# Patient Record
Sex: Female | Born: 1955 | Hispanic: No | Smoking: Never smoker
Health system: Southern US, Community
[De-identification: ages and names within clinical notes are randomized; demographics above are authoritative.]

## PROBLEM LIST (undated history)

## (undated) DIAGNOSIS — M1990 Unspecified osteoarthritis, unspecified site: Secondary | ICD-10-CM

## (undated) DIAGNOSIS — M069 Rheumatoid arthritis, unspecified: Secondary | ICD-10-CM

## (undated) HISTORY — PX: TUBAL LIGATION: SHX77

## (undated) HISTORY — PX: CHOLECYSTECTOMY: SHX55

---

## 1998-08-19 ENCOUNTER — Other Ambulatory Visit: Admission: RE | Admit: 1998-08-19 | Discharge: 1998-08-19 | Payer: Self-pay | Admitting: Obstetrics and Gynecology

## 1999-08-06 ENCOUNTER — Other Ambulatory Visit: Admission: RE | Admit: 1999-08-06 | Discharge: 1999-08-06 | Payer: Self-pay | Admitting: Obstetrics and Gynecology

## 2000-08-16 ENCOUNTER — Other Ambulatory Visit: Admission: RE | Admit: 2000-08-16 | Discharge: 2000-08-16 | Payer: Self-pay | Admitting: Obstetrics and Gynecology

## 2000-08-16 ENCOUNTER — Encounter: Payer: Self-pay | Admitting: Obstetrics and Gynecology

## 2000-08-16 ENCOUNTER — Encounter: Admission: RE | Admit: 2000-08-16 | Discharge: 2000-08-16 | Payer: Self-pay | Admitting: Obstetrics and Gynecology

## 2001-07-26 ENCOUNTER — Encounter: Payer: Self-pay | Admitting: Obstetrics and Gynecology

## 2001-07-26 ENCOUNTER — Other Ambulatory Visit: Admission: RE | Admit: 2001-07-26 | Discharge: 2001-07-26 | Payer: Self-pay | Admitting: Obstetrics and Gynecology

## 2001-07-26 ENCOUNTER — Encounter: Admission: RE | Admit: 2001-07-26 | Discharge: 2001-07-26 | Payer: Self-pay | Admitting: Obstetrics and Gynecology

## 2001-08-01 ENCOUNTER — Other Ambulatory Visit: Admission: RE | Admit: 2001-08-01 | Discharge: 2001-08-01 | Payer: Self-pay | Admitting: Obstetrics and Gynecology

## 2001-08-01 ENCOUNTER — Encounter (INDEPENDENT_AMBULATORY_CARE_PROVIDER_SITE_OTHER): Payer: Self-pay | Admitting: Specialist

## 2002-02-28 ENCOUNTER — Encounter: Payer: Self-pay | Admitting: Obstetrics and Gynecology

## 2002-02-28 ENCOUNTER — Encounter: Admission: RE | Admit: 2002-02-28 | Discharge: 2002-02-28 | Payer: Self-pay | Admitting: Obstetrics and Gynecology

## 2002-03-01 ENCOUNTER — Other Ambulatory Visit: Admission: RE | Admit: 2002-03-01 | Discharge: 2002-03-01 | Payer: Self-pay | Admitting: Obstetrics and Gynecology

## 2003-10-01 ENCOUNTER — Encounter: Admission: RE | Admit: 2003-10-01 | Discharge: 2003-10-01 | Payer: Self-pay | Admitting: Obstetrics and Gynecology

## 2003-10-01 ENCOUNTER — Other Ambulatory Visit: Admission: RE | Admit: 2003-10-01 | Discharge: 2003-10-01 | Payer: Self-pay | Admitting: Obstetrics and Gynecology

## 2006-03-14 ENCOUNTER — Encounter: Admission: RE | Admit: 2006-03-14 | Discharge: 2006-03-14 | Payer: Self-pay | Admitting: Obstetrics and Gynecology

## 2007-03-30 ENCOUNTER — Encounter: Admission: RE | Admit: 2007-03-30 | Discharge: 2007-03-30 | Payer: Self-pay | Admitting: Obstetrics and Gynecology

## 2009-10-17 ENCOUNTER — Encounter: Admission: RE | Admit: 2009-10-17 | Discharge: 2009-10-17 | Payer: Self-pay | Admitting: Obstetrics and Gynecology

## 2009-10-22 ENCOUNTER — Encounter: Admission: RE | Admit: 2009-10-22 | Discharge: 2009-10-22 | Payer: Self-pay | Admitting: Obstetrics and Gynecology

## 2010-12-19 ENCOUNTER — Encounter: Payer: Self-pay | Admitting: Obstetrics and Gynecology

## 2012-12-12 ENCOUNTER — Other Ambulatory Visit: Payer: Self-pay | Admitting: Obstetrics and Gynecology

## 2012-12-12 DIAGNOSIS — N644 Mastodynia: Secondary | ICD-10-CM

## 2012-12-12 DIAGNOSIS — N6321 Unspecified lump in the left breast, upper outer quadrant: Secondary | ICD-10-CM

## 2012-12-13 ENCOUNTER — Other Ambulatory Visit: Payer: Self-pay | Admitting: Obstetrics and Gynecology

## 2012-12-13 DIAGNOSIS — N6321 Unspecified lump in the left breast, upper outer quadrant: Secondary | ICD-10-CM

## 2012-12-21 ENCOUNTER — Ambulatory Visit
Admission: RE | Admit: 2012-12-21 | Discharge: 2012-12-21 | Disposition: A | Discharge status: Home / Self Care | Payer: Self-pay | Source: Ambulatory Visit | Attending: Obstetrics and Gynecology | Admitting: Obstetrics and Gynecology

## 2012-12-21 DIAGNOSIS — N6321 Unspecified lump in the left breast, upper outer quadrant: Secondary | ICD-10-CM

## 2013-06-20 ENCOUNTER — Ambulatory Visit: Payer: Self-pay | Admitting: Family Medicine

## 2013-06-20 ENCOUNTER — Ambulatory Visit: Payer: Self-pay

## 2013-06-20 VITALS — BP 122/80 | HR 74 | Temp 98.2°F | Resp 16 | Ht 63.0 in | Wt 167.4 lb

## 2013-06-20 DIAGNOSIS — S59902A Unspecified injury of left elbow, initial encounter: Secondary | ICD-10-CM

## 2013-06-20 DIAGNOSIS — S80212A Abrasion, left knee, initial encounter: Secondary | ICD-10-CM

## 2013-06-20 DIAGNOSIS — IMO0002 Reserved for concepts with insufficient information to code with codable children: Secondary | ICD-10-CM

## 2013-06-20 DIAGNOSIS — S59919A Unspecified injury of unspecified forearm, initial encounter: Secondary | ICD-10-CM

## 2013-06-20 DIAGNOSIS — S60511A Abrasion of right hand, initial encounter: Secondary | ICD-10-CM

## 2013-06-20 DIAGNOSIS — S59909A Unspecified injury of unspecified elbow, initial encounter: Secondary | ICD-10-CM

## 2013-06-20 DIAGNOSIS — Z23 Encounter for immunization: Secondary | ICD-10-CM

## 2013-06-20 MED ORDER — HYDROCODONE-ACETAMINOPHEN 5-325 MG PO TABS: 1.0000 | ORAL_TABLET | Freq: Three times a day (TID) | ORAL | Status: DC | PRN

## 2013-06-20 MED ORDER — TETANUS-DIPHTHERIA TOXOIDS TD 2-2 LF/0.5ML IM SUSP: 0.5000 mL | Freq: Once | INTRAMUSCULAR | Status: DC

## 2013-06-20 NOTE — Progress Notes (Signed)
[facility] [address], [place] [address] [phone]  Date:  06/20/2013   Name:  Elizabeth Yoder   DOB:  [birth year]   MRN:  [id number]  PCP:  No primary provider on file.    Chief Complaint: Elbow Injury, Knee Injury and Extremity Laceration   History of Present Illness:  Elizabeth Yoder is a 57 y.o. very pleasant female patient who presents with the following:  She was taking out some trash around 0900 this am- she tripped and fell onto her left arm and hand, and her left knee on a cememnt driveway.  Also cut the palm of her right hand. The left elbow is the main problem.  She is otherwise generally healthy.  Last tetanus shot at least 20 years ago.  She usually lives in [place] doing [place] work, but [place] elderly friend.   No head injury, no LOC.  She had one hydrocodone pill left over from before that she took.  Right now her pain is not bad.    There are no active problems to display for this patient.   History reviewed. No pertinent past medical history.  Past Surgical History  Procedure Laterality Date  . Tubal ligation      History  Substance Use Topics  . Smoking status: Never Smoker   . Smokeless tobacco: Not on file  . Alcohol Use: Not on file    Family History  Problem Relation Age of Onset  . Hypertension Mother   . Heart disease Mother   . Cancer Father   . Heart disease Father   . Hypertension Sister     No Known Allergies  Medication list has been reviewed and updated.  No current outpatient prescriptions on file prior to visit.   No current facility-administered medications on file prior to visit.    Review of Systems:  As per HPI- otherwise negative.   Physical Examination: Filed Vitals:   06/20/13 1521  BP: 122/80  Pulse: 74  Temp: 98.2 F (36.8 C)  Resp: 16   Filed Vitals:   06/20/13 1521  Height: 5' 3"  (1.6 m)  Weight: 167 lb 6.4 oz (75.932 kg)   Body  mass index is 29.66 kg/(m^2). Ideal Body Weight: Weight in (lb) to have BMI = 25: 140.8  GEN: WDWN, NAD, Non-toxic, A & O x 3, looks well HEENT: Atraumatic, Normocephalic. Neck supple. No masses, No LAD.  Bilateral TM wnl, oropharynx normal.  PEERL,EOMI.   Cervical spine negative- normal ROM and no pain Ears and Nose: No external deformity. CV: RRR, No M/G/R. No JVD. No thrill. No extra heart sounds. PULM: CTA B, no wheezes, crackles, rhonchi. No retractions. No resp. distress. No accessory muscle use. ABD: S, NT, ND, +BS. No rebound. No HSM. EXTR: No c/c/e NEURO Normal gait.  PSYCH: Normally interactive. Conversant. Not depressed or anxious appearing.  Calm demeanor.  Heel of right hand: she has abvulsed out a small piece of dermis over the hypothenar eminence.  Less than dime- sized area.  Shallow. No suture needed Left knee: abrasion, but is able to walk and flex/ extend the knee fully without pain.   Left elbow: bruising and swelling over the lateral elbow.  She has full flexion/ extension/ supination and pronation without pain.  There is a well- demarcated blanching red area over the elbow as well.  Suspect this may be due to cold pack use.   Left shoulder  is located and has full ROM.   [facility] reading (PRIMARY) by  Dr. Lorelei Pont. Left elbow: negative  LEFT ELBOW - COMPLETE 3+ VIEW  Comparison: None.  Findings: No fracture or dislocation is noted. Joint spaces are intact. No radiopaque foreign body is seen. No abnormal fat pad displacement is noted.  IMPRESSION: Normal left elbow.  Clinically significant discrepancy from primary report, if provided: None  Placed in a sugar tong splint and sling today.   Assessment and Plan: Elbow injury, left, initial encounter - Plan: DG Elbow Complete Left, HYDROcodone-acetaminophen (NORCO/VICODIN) 5-325 MG per tablet, Td vaccine greater than or equal to 7yo preservative free IM  Abrasion, hand, right, initial encounter - Plan: Td vaccine  greater than or equal to 7yo preservative free IM, DISCONTINUED: diptheria-tetanus toxoids (DECAVAC) 2-2 LF/0.5ML injection  Abrasion of left knee, initial encounter - Plan: Td vaccine greater than or equal to 7yo preservative free IM  Contusion of left elbow.  Swelling and bruising do suggest an occult fracture.  Placed in a splint and sling.  Detailed instructions regarding splint use- prolonged use can lead to stiffness of the elbow.  Asked to follow- up closely if pain persists.  Can try ROM exercises tomorrow.  If pain free can d/c splint.  If still having pain in 2 or 3 days RTC.   Pt declined x-rays of knee or left humerus today due to expense Updated td vaccination.   Pt requested pain medication in case it was needed.  Gave supply of hydrocodone to use if needed.    Signed Lamar Blinks, MD  7/24- called to check on pt.  She is "aching all over."  Her knee and elbow are still painful.  She has not yet tried ROM of her elbow.  Again instructed to come back in tomorrow or Saturday if not seeing good improvement.  She agreed.

## 2013-06-20 NOTE — Patient Instructions (Addendum)
I do not see any definite broken bone in your elbow.  However, due to your bruising and swelling we are going to place your elbow in a splint for protection.  If your elbow feels much better in 2 or 3 days then it is likely just bruised.  However, if it continues to bother you please come in for a recheck.    Use the pain medication only if needed for more severe pain- remember it can make you sleepy.    Do not wear the elbow splint for more than 2 or 3 days without coming in for a recheck.  Your elbow can become stiff if you wear it for too long.

## 2013-06-23 ENCOUNTER — Ambulatory Visit: Payer: Self-pay | Admitting: Emergency Medicine

## 2013-06-23 ENCOUNTER — Ambulatory Visit: Payer: Self-pay

## 2013-06-23 VITALS — BP 126/78 | HR 80 | Temp 98.5°F | Resp 18 | Ht 63.0 in | Wt 167.0 lb

## 2013-06-23 DIAGNOSIS — S6990XA Unspecified injury of unspecified wrist, hand and finger(s), initial encounter: Secondary | ICD-10-CM

## 2013-06-23 DIAGNOSIS — M25562 Pain in left knee: Secondary | ICD-10-CM

## 2013-06-23 DIAGNOSIS — M25569 Pain in unspecified knee: Secondary | ICD-10-CM

## 2013-06-23 DIAGNOSIS — M549 Dorsalgia, unspecified: Secondary | ICD-10-CM

## 2013-06-23 DIAGNOSIS — S59909A Unspecified injury of unspecified elbow, initial encounter: Secondary | ICD-10-CM

## 2013-06-23 DIAGNOSIS — S59902A Unspecified injury of left elbow, initial encounter: Secondary | ICD-10-CM

## 2013-06-23 DIAGNOSIS — R935 Abnormal findings on diagnostic imaging of other abdominal regions, including retroperitoneum: Secondary | ICD-10-CM

## 2013-06-23 MED ORDER — HYDROCODONE-ACETAMINOPHEN 5-325 MG PO TABS: ORAL_TABLET | ORAL | Status: DC

## 2013-06-23 MED ORDER — MUPIROCIN CALCIUM 2 % EX CREA: TOPICAL_CREAM | Freq: Three times a day (TID) | CUTANEOUS | Status: DC

## 2013-06-23 NOTE — Progress Notes (Signed)
  Subjective:    Patient ID: Elizabeth Yoder, female    DOB: 1956/10/10, 57 y.o.   MRN: [id number]  HPI pt presents with follow up on injuries. Continues to have pain in left knee and left arm. Knee pain is the worst. Taking pain pill every 5-6 hours. She is also having pain in lower back and lower abdomen.    Review of Systems     Objective:   Physical Exam there is a large hematoma over the lateral left elbow. She does maintain flexion extension. Neurovascular exam of the left hand is normal. Examination of the left knee reveals 2 1 x 2 cm abraded areas with about 1 cm of surrounding redness. There is tenderness over the lower lumbar spine deep tendon reflexes are symmetrical  [facility] reading (PRIMARY) by  Dr.Daubthere is a questionable 3 mm cortical irregularity of the medial tibial plateau there is a golf ball sized calcification in the right upper abdomen seen on LS-spine films. There is also L5-S1 disc disease with arthritic changes.        Assessment & Plan:  Pain meds were refilled. She is to use Bactroban ointment. We'll schedule an ultrasound of her upper abdomen

## 2013-06-23 NOTE — Patient Instructions (Addendum)
He will be scheduled for an ultrasound of your abdomen. Take one half pain pills every 6-8 hours as needed for pain. Recheck in one week.

## 2013-06-28 ENCOUNTER — Ambulatory Visit
Admission: RE | Admit: 2013-06-28 | Discharge: 2013-06-28 | Disposition: A | Discharge status: Home / Self Care | Payer: No Typology Code available for payment source | Source: Ambulatory Visit | Attending: Emergency Medicine | Admitting: Emergency Medicine

## 2013-06-28 DIAGNOSIS — R935 Abnormal findings on diagnostic imaging of other abdominal regions, including retroperitoneum: Secondary | ICD-10-CM

## 2013-06-29 ENCOUNTER — Ambulatory Visit: Payer: Self-pay | Admitting: Emergency Medicine

## 2013-06-29 ENCOUNTER — Ambulatory Visit: Payer: Self-pay

## 2013-06-29 VITALS — BP 128/74 | HR 89 | Temp 98.0°F | Resp 18 | Ht 63.0 in | Wt 167.0 lb

## 2013-06-29 DIAGNOSIS — N281 Cyst of kidney, acquired: Secondary | ICD-10-CM | POA: Insufficient documentation

## 2013-06-29 DIAGNOSIS — M25539 Pain in unspecified wrist: Secondary | ICD-10-CM

## 2013-06-29 DIAGNOSIS — Z5189 Encounter for other specified aftercare: Secondary | ICD-10-CM

## 2013-06-29 DIAGNOSIS — M25532 Pain in left wrist: Secondary | ICD-10-CM

## 2013-06-29 DIAGNOSIS — K8020 Calculus of gallbladder without cholecystitis without obstruction: Secondary | ICD-10-CM | POA: Insufficient documentation

## 2013-06-29 DIAGNOSIS — S5002XD Contusion of left elbow, subsequent encounter: Secondary | ICD-10-CM

## 2013-06-29 DIAGNOSIS — Q619 Cystic kidney disease, unspecified: Secondary | ICD-10-CM

## 2013-06-29 DIAGNOSIS — N200 Calculus of kidney: Secondary | ICD-10-CM | POA: Insufficient documentation

## 2013-06-29 NOTE — Progress Notes (Signed)
  Subjective:    Patient ID: Elizabeth Yoder, female    DOB: [birth year], 57 y.o.   MRN: [id number]  HPI Patient comes in today for a follow up on left elbow injury, laceration to knee and to receive results of her ultrasound. She has been using the antibiotic ointment on her knee twice daily. She states that her arm is feeling better.    Review of Systems     Objective:   Physical Exam    [facility] reading (PRIMARY) by  Dr.Charde Macfarlane no fractures seen      Assessment & Plan:

## 2013-07-03 ENCOUNTER — Encounter: Payer: Self-pay | Admitting: Radiology

## 2013-07-19 ENCOUNTER — Ambulatory Visit (INDEPENDENT_AMBULATORY_CARE_PROVIDER_SITE_OTHER): Payer: Self-pay | Admitting: General Surgery

## 2014-07-07 ENCOUNTER — Encounter: Payer: Self-pay | Admitting: Emergency Medicine

## 2014-07-07 ENCOUNTER — Encounter (HOSPITAL_COMMUNITY): Payer: Self-pay | Admitting: Emergency Medicine

## 2014-07-07 ENCOUNTER — Emergency Department (HOSPITAL_COMMUNITY): Payer: Self-pay

## 2014-07-07 ENCOUNTER — Ambulatory Visit (INDEPENDENT_AMBULATORY_CARE_PROVIDER_SITE_OTHER): Payer: Self-pay | Admitting: Emergency Medicine

## 2014-07-07 ENCOUNTER — Emergency Department (HOSPITAL_COMMUNITY)
Admission: EM | Admit: 2014-07-07 | Discharge: 2014-07-07 | Disposition: A | Discharge status: Home / Self Care | Payer: Self-pay | Attending: Emergency Medicine | Admitting: Emergency Medicine

## 2014-07-07 VITALS — BP 126/82 | HR 87 | Temp 98.3°F | Resp 18 | Ht 63.0 in | Wt 182.2 lb

## 2014-07-07 DIAGNOSIS — R52 Pain, unspecified: Secondary | ICD-10-CM | POA: Insufficient documentation

## 2014-07-07 DIAGNOSIS — Q619 Cystic kidney disease, unspecified: Secondary | ICD-10-CM

## 2014-07-07 DIAGNOSIS — R109 Unspecified abdominal pain: Secondary | ICD-10-CM

## 2014-07-07 DIAGNOSIS — N281 Cyst of kidney, acquired: Secondary | ICD-10-CM

## 2014-07-07 DIAGNOSIS — Z79899 Other long term (current) drug therapy: Secondary | ICD-10-CM | POA: Insufficient documentation

## 2014-07-07 DIAGNOSIS — Z7982 Long term (current) use of aspirin: Secondary | ICD-10-CM | POA: Insufficient documentation

## 2014-07-07 DIAGNOSIS — M79609 Pain in unspecified limb: Secondary | ICD-10-CM | POA: Insufficient documentation

## 2014-07-07 DIAGNOSIS — M79605 Pain in left leg: Secondary | ICD-10-CM

## 2014-07-07 DIAGNOSIS — R1032 Left lower quadrant pain: Secondary | ICD-10-CM

## 2014-07-07 LAB — POCT CBC
GRANULOCYTE PERCENT: 80 % (ref 37–80)
HCT, POC: 43.3 % (ref 37.7–47.9)
Hemoglobin: 14 g/dL (ref 12.2–16.2)
Lymph, poc: 1.4 (ref 0.6–3.4)
MCH: 29.4 pg (ref 27–31.2)
MCHC: 32.2 g/dL (ref 31.8–35.4)
MCV: 91.3 fL (ref 80–97)
MID (cbc): 0.5 (ref 0–0.9)
MPV: 8.4 fL (ref 0–99.8)
PLATELET COUNT, POC: 212 10*3/uL (ref 142–424)
POC GRANULOCYTE: 7.6 — AB (ref 2–6.9)
POC LYMPH %: 15.1 % (ref 10–50)
POC MID %: 4.9 % (ref 0–12)
RBC: 4.74 M/uL (ref 4.04–5.48)
RDW, POC: 13.4 %
WBC: 9.5 10*3/uL (ref 4.6–10.2)

## 2014-07-07 MED ORDER — KETOROLAC TROMETHAMINE 60 MG/2ML IM SOLN
60.0000 mg | Freq: Once | INTRAMUSCULAR | Status: AC
  Administered 2014-07-07: 60 mg via INTRAMUSCULAR

## 2014-07-07 MED ORDER — HYDROCODONE-ACETAMINOPHEN 5-325 MG PO TABS
2.0000 | ORAL_TABLET | Freq: Once | ORAL | Status: AC
  Administered 2014-07-07: 1 via ORAL
  Filled 2014-07-07: qty 2

## 2014-07-07 MED ORDER — ONDANSETRON HCL 4 MG PO TABS: 4.0000 mg | ORAL_TABLET | Freq: Four times a day (QID) | ORAL | Status: DC

## 2014-07-07 MED ORDER — HYDROCODONE-ACETAMINOPHEN 5-325 MG PO TABS: 2.0000 | ORAL_TABLET | ORAL | Status: DC | PRN

## 2014-07-07 NOTE — ED Notes (Signed)
Bed: HD39 Expected date: 07/07/14 Expected time: 11:43 AM Means of arrival: Ambulance Comments: Leg pain

## 2014-07-07 NOTE — ED Notes (Signed)
Pt has good pulse throughout extremities a warm to touch and color within norm, pt states she has obtained some relief from pain shot. Places hand on upper anterior thigh area when asked to verify area of most pain.

## 2014-07-07 NOTE — ED Provider Notes (Signed)
CSN: [id number]     Arrival date & time 07/07/14  1141 History   First MD Initiated Contact with Patient 07/07/14 1205     Chief Complaint  Patient presents with  . Leg Pain     (Consider location/radiation/quality/duration/timing/severity/associated sxs/prior Treatment) HPI Comments: 58 year old female with no significant medical history presents with left thigh pain since 3 this morning and pain with walking. Patient was taken urgent care and unable to control her pain thus sent here for further evaluation. Patient denies blood clot history, recent surgery, active cancer, long travel, leg swelling or injury. Patient denies hip problems. Pain is worse with walking and palpation. No fevers or chills. No abdominal pain chest pain or shortness of breath  Patient is a 58 y.o. female presenting with leg pain. The history is provided by the patient.  Leg Pain Associated symptoms: no back pain, no fever and no neck pain     History reviewed. No pertinent past medical history. Past Surgical History  Procedure Laterality Date  . Tubal ligation     Family History  Problem Relation Age of Onset  . Hypertension Mother   . Heart disease Mother   . Cancer Father   . Heart disease Father   . Hypertension Sister    History  Substance Use Topics  . Smoking status: Never Smoker   . Smokeless tobacco: Not on file  . Alcohol Use: No   OB History   Grav Para Term Preterm Abortions TAB SAB Ect Mult Living                 Review of Systems  Constitutional: Negative for fever and chills.  HENT: Negative for congestion.   Eyes: Negative for visual disturbance.  Respiratory: Negative for shortness of breath.   Cardiovascular: Negative for chest pain.  Gastrointestinal: Negative for vomiting and abdominal pain.  Genitourinary: Negative for dysuria and flank pain.  Musculoskeletal: Positive for gait problem. Negative for back pain, neck pain and neck stiffness.  Skin: Negative for rash.   Neurological: Negative for light-headedness and headaches.      Allergies  Review of patient's allergies indicates no known allergies.  Home Medications   Prior to Admission medications   Medication Sig Start Date End Date Taking? Authorizing Provider  Ascorbic Acid (VITAMIN C) 100 MG tablet Take 100 mg by mouth daily.   Yes Historical Provider, MD  aspirin 81 MG tablet Take 81 mg by mouth daily.   Yes Historical Provider, MD  zolpidem (AMBIEN) 10 MG tablet Take 10 mg by mouth at bedtime as needed for sleep.   Yes Historical Provider, MD   BP 144/86  Pulse 83  Temp(Src) 98.4 F (36.9 C) (Oral)  Resp 16  SpO2 96% Physical Exam  Nursing note and vitals reviewed. Constitutional: She is oriented to person, place, and time. She appears well-developed and well-nourished.  HENT:  Head: Normocephalic and atraumatic.  Eyes: Conjunctivae are normal. Right eye exhibits no discharge. Left eye exhibits no discharge.  Neck: Normal range of motion. Neck supple. No tracheal deviation present.  Cardiovascular: Normal rate and regular rhythm.   Pulmonary/Chest: Effort normal and breath sounds normal.  Abdominal: Soft. She exhibits no distension. There is no tenderness. There is no guarding.  Musculoskeletal: She exhibits tenderness. She exhibits no edema.  Patient has tenderness proximal anterior thigh without sign of infection or ecchymosis. Patient's full range of motion of left hip with mild discomfort anteriorly. Patient has no swelling to the leg or calf  tenderness., Neurovascularly intact left lower family. Normal sensation distal equal bilateral lower extremities. Difficult strength testing the left leg due to pain however patient has 5+ flexion extension and great toe and ankle and flexion of left knee. Patient can flex and extend left hip however significant pain.  Neurological: She is alert and oriented to person, place, and time.  Skin: Skin is warm. No rash noted.  Psychiatric: She  has a normal mood and affect.    ED Course  Procedures (including critical care time) Labs Review Labs Reviewed - No data to display  Imaging Review No results found.   EKG Interpretation None      MDM   Final diagnoses:  Acute leg pain, left   Discussed clinically musculoskeletal versus blood clot. Plan for x-ray and ultrasound of the left lower extremity, pain meds given.  X-ray no acute fracture reviewed. Ultrasound results reviewed no blood clot. Pain improved in ER and outpatient followup discussed. Results and differential diagnosis were discussed with the patient/parent/guardian. Close follow up outpatient was discussed, comfortable with the plan.   Medications  HYDROcodone-acetaminophen (NORCO/VICODIN) 5-325 MG per tablet 2 tablet (not administered)    Filed Vitals:   07/07/14 1142  BP: 144/86  Pulse: 83  Temp: 98.4 F (36.9 C)  TempSrc: Oral  Resp: 16  SpO2: 96%        Mariea Clonts, MD 07/07/14 1537

## 2014-07-07 NOTE — Progress Notes (Signed)
*  PRELIMINARY RESULTS* Vascular Ultrasound Left lower extremity venous duplex has been completed.  Preliminary findings: No evidence of DVT or baker's cyst.  Landry Mellow, RDMS, RVT  07/07/2014, 2:58 PM

## 2014-07-07 NOTE — ED Notes (Addendum)
Pt states she went to the bathroom about 3am, noted to have left leg pain and difficulty walking, this morning sister took her to urgent care, even more difficulty walking, sent here for futher evaluation. Given toradol 60 mg at urgent care.

## 2014-07-07 NOTE — Progress Notes (Signed)
   Subjective:    Patient ID: Elizabeth Yoder, female    DOB: 24-Apr-1956, 58 y.o.   MRN: [id number]  HPI 58 year old caucasian female presents to clinic today with complains of pain in the left groin area since 3am this morning.   Pt denies pain in the low back.  No known injury or unusual activities. Pt has great difficulty moving due to LLE pain which includes getting in car for transport to the clinic.   Pt returned from [place] in March 2015.   Review of Systems she has previous history of gallstones renal stones and cystic area in the kidney. She has been positive a followup ultrasound in July of this year.     Objective:   Physical Exam Pt unable to SLR left lower extremity. Abdomen soft there are no masses no tenderness. There is significant tenderness over the abductor mass of the left. There is pain with any movement around the left hip. Deep tendon reflexes of the lower turbinates are 2+ and symmetrical. She is unable to flex or abduct the left hip. Results for orders placed in visit on 07/07/14  POCT CBC      Result Value Ref Range   WBC 9.5  4.6 - 10.2 K/uL   Lymph, poc 1.4  0.6 - 3.4   POC LYMPH PERCENT 15.1  10 - 50 %L   MID (cbc) 0.5  0 - 0.9   POC MID % 4.9  0 - 12 %M   POC Granulocyte 7.6 (*) 2 - 6.9   Granulocyte percent 80.0  37 - 80 %G   RBC 4.74  4.04 - 5.48 M/uL   Hemoglobin 14.0  12.2 - 16.2 g/dL   HCT, POC 43.3  37.7 - 47.9 %   MCV 91.3  80 - 97 fL   MCH, POC 29.4  27 - 31.2 pg   MCHC 32.2  31.8 - 35.4 g/dL   RDW, POC 13.4     Platelet Count, POC 212  142 - 424 K/uL   MPV 8.4  0 - 99.8 fL       Assessment & Plan:  She was given 60 of Toradol with minimal relief. She will go by ambulance for evaluation of severe left groin pain. Consideration for routine imaging consideration for Doppler studies. She was unable to bear weight here in the office and so was sent by ambulance . Ultrasound followup left kidney cyst was ordered. Since she was not able to  ambulate in the office or go to x-ray she was sent to the hospital so they could do the workup of her severe groin pain.

## 2014-07-07 NOTE — Discharge Instructions (Signed)
Take ibuprofen and use ice as tolerated for pain. For severe pain take norco or vicodin however realize they have the potential for addiction and it can make you sleepy and has tylenol in it.  No operating machinery while taking. If you were given medicines take as directed.  If you are on coumadin or contraceptives realize their levels and effectiveness is altered by many different medicines.  If you have any reaction (rash, tongues swelling, other) to the medicines stop taking and see a physician.   Please follow up as directed and return to the ER or see a physician for new or worsening symptoms.  Thank you. Filed Vitals:   07/07/14 1142 07/07/14 1422  BP: 144/86 140/82  Pulse: 83 81  Temp: 98.4 F (36.9 C) 97.9 F (36.6 C)  TempSrc: Oral Oral  Resp: 16 16  SpO2: 96% 97%

## 2014-07-07 NOTE — ED Notes (Signed)
Pt wheeled to car with a visitor by this RN

## 2014-07-07 NOTE — ED Notes (Addendum)
Pt ambulating to restroom with NT

## 2014-07-12 ENCOUNTER — Ambulatory Visit
Admission: RE | Admit: 2014-07-12 | Discharge: 2014-07-12 | Disposition: A | Discharge status: Home / Self Care | Payer: No Typology Code available for payment source | Source: Ambulatory Visit | Attending: Emergency Medicine | Admitting: Emergency Medicine

## 2014-07-12 DIAGNOSIS — N281 Cyst of kidney, acquired: Secondary | ICD-10-CM

## 2014-07-18 ENCOUNTER — Other Ambulatory Visit: Payer: Self-pay

## 2014-07-18 DIAGNOSIS — N281 Cyst of kidney, acquired: Secondary | ICD-10-CM

## 2014-07-18 DIAGNOSIS — K8020 Calculus of gallbladder without cholecystitis without obstruction: Secondary | ICD-10-CM

## 2014-07-30 ENCOUNTER — Ambulatory Visit (INDEPENDENT_AMBULATORY_CARE_PROVIDER_SITE_OTHER): Payer: Self-pay | Admitting: General Surgery

## 2015-03-20 ENCOUNTER — Other Ambulatory Visit: Payer: Self-pay

## 2015-03-20 DIAGNOSIS — Z1231 Encounter for screening mammogram for malignant neoplasm of breast: Secondary | ICD-10-CM

## 2015-04-09 ENCOUNTER — Ambulatory Visit: Payer: Self-pay

## 2015-04-10 ENCOUNTER — Ambulatory Visit
Admission: RE | Admit: 2015-04-10 | Discharge: 2015-04-10 | Disposition: A | Discharge status: Home / Self Care | Payer: No Typology Code available for payment source | Source: Ambulatory Visit

## 2015-04-10 DIAGNOSIS — Z1231 Encounter for screening mammogram for malignant neoplasm of breast: Secondary | ICD-10-CM

## 2015-04-14 ENCOUNTER — Ambulatory Visit: Payer: Self-pay

## 2015-09-02 ENCOUNTER — Encounter: Payer: Self-pay | Admitting: Emergency Medicine

## 2017-09-12 ENCOUNTER — Ambulatory Visit (INDEPENDENT_AMBULATORY_CARE_PROVIDER_SITE_OTHER): Payer: BLUE CROSS/BLUE SHIELD | Admitting: Family Medicine

## 2017-09-12 ENCOUNTER — Encounter: Payer: Self-pay | Admitting: Family Medicine

## 2017-09-12 VITALS — BP 142/88 | HR 85 | Temp 98.0°F | Resp 16 | Ht 63.39 in | Wt 188.0 lb

## 2017-09-12 DIAGNOSIS — L243 Irritant contact dermatitis due to cosmetics: Secondary | ICD-10-CM

## 2017-09-12 DIAGNOSIS — R221 Localized swelling, mass and lump, neck: Secondary | ICD-10-CM

## 2017-09-12 DIAGNOSIS — R590 Localized enlarged lymph nodes: Secondary | ICD-10-CM | POA: Diagnosis not present

## 2017-09-12 DIAGNOSIS — R030 Elevated blood-pressure reading, without diagnosis of hypertension: Secondary | ICD-10-CM | POA: Diagnosis not present

## 2017-09-12 DIAGNOSIS — J0100 Acute maxillary sinusitis, unspecified: Secondary | ICD-10-CM

## 2017-09-12 MED ORDER — AMOXICILLIN-POT CLAVULANATE 875-125 MG PO TABS: 1.0000 | ORAL_TABLET | Freq: Two times a day (BID) | ORAL | 0 refills | Status: DC

## 2017-09-12 MED ORDER — TRIAMCINOLONE ACETONIDE 0.1 % EX CREA: 1.0000 "application " | TOPICAL_CREAM | Freq: Two times a day (BID) | CUTANEOUS | 0 refills | Status: DC

## 2017-09-12 MED ORDER — LORATADINE 10 MG PO TABS: 10.0000 mg | ORAL_TABLET | Freq: Every day | ORAL | 2 refills | Status: DC

## 2017-09-12 NOTE — Progress Notes (Signed)
Subjective:    Patient ID: Elizabeth Yoder, female    DOB: 1956-11-07, 60 y.o.   MRN: [id number]  09/12/2017  Cyst (on  both sides of the neck )    HPI This 61 y.o. female presents for evaluation of lymph node swelling for two months. Denies fever/chills/sweats. Denies headaches, dizziness; denies ear pain or sore throat; denies dental pain or mouth ulcerations, lesions, pain.  Reports ome + rhinorrhea/nasal congestion/PND; admits to LEFT nasal congestion green and some sinus pressure.  Denies cough.  Denies scalp irritation or rash.  Does report enlarging mole on L posterior scalp behind L ear. Denies unintentional weight loss or night sweats.  No exposure to tuberculosis.  Having some skin irritation from a new topical product.   Last dermatology evaluation January 2018.  No skin cancers. Has several moles.   BP Readings from Last 3 Encounters:  09/12/17 (!) 142/88  07/07/14 124/70  07/07/14 126/82   Wt Readings from Last 3 Encounters:  09/12/17 188 lb (85.3 kg)  07/07/14 182 lb 3.2 oz (82.6 kg)  06/29/13 167 lb (75.8 kg)   Immunization History  Administered Date(s) Administered  . Td 06/20/2013    Review of Systems  Constitutional: Negative for chills, diaphoresis, fatigue and fever.  HENT: Positive for congestion, postnasal drip, rhinorrhea and sinus pressure. Negative for dental problem, drooling, ear discharge, ear pain, facial swelling, hearing loss, mouth sores, nosebleeds, sinus pain, sneezing, sore throat, tinnitus, trouble swallowing and voice change.   Eyes: Negative for visual disturbance.  Respiratory: Negative for cough and shortness of breath.   Cardiovascular: Negative for chest pain, palpitations and leg swelling.  Gastrointestinal: Negative for abdominal pain, constipation, diarrhea, nausea and vomiting.  Endocrine: Negative for cold intolerance, heat intolerance, polydipsia, polyphagia and polyuria.  Skin: Positive for rash. Negative for color change,  pallor and wound.  Neurological: Negative for dizziness, tremors, seizures, syncope, facial asymmetry, speech difficulty, weakness, light-headedness, numbness and headaches.  Hematological: Positive for adenopathy.    History reviewed. No pertinent past medical history. Past Surgical History:  Procedure Laterality Date  . TUBAL LIGATION     No Known Allergies No current outpatient prescriptions on file prior to visit.   No current facility-administered medications on file prior to visit.    Social History   Social History  . Marital status: Divorced    Spouse name: N/A  . Number of children: 1  . Years of education: N/A   Occupational History  . [employer] work    Social History Main Topics  . Smoking status: Never Smoker  . Smokeless tobacco: Never Used  . Alcohol use No  . Drug use: No  . Sexual activity: Yes    Birth control/ protection: Post-menopausal   Other Topics Concern  . Not on file   Social History Narrative   Marital status: divorced in 2009; not dating in 2018      Children: 1 son; 2 grandchildren; 1 gg.       Lives: with son       Employment;  [employer] work in 2018      Tobacco: none      Alcohol: none      Drugs: none         Family History  Problem Relation Age of Onset  . Hypertension Mother   . Heart disease Mother 19       mild AMI/no stenting  . Cancer Father        unknown cancer? stomach? tobacco  .  Heart disease Father 76       AMI as cause of death  . Hypertension Sister   . Hyperlipidemia Sister        Objective:    BP (!) 142/88   Pulse 85   Temp 98 F (36.7 C) (Oral)   Resp 16   Ht 5' 3.39" (1.61 m)   Wt 188 lb (85.3 kg)   SpO2 95%   BMI 32.90 kg/m  Physical Exam  Constitutional: She is oriented to person, place, and time. She appears well-developed and well-nourished. No distress.  HENT:  Head: Normocephalic and atraumatic.  Right Ear: External ear normal.  Left Ear: External ear normal.  Nose: Nose normal.    Mouth/Throat: Oropharynx is clear and moist.  Eyes: Pupils are equal, round, and reactive to light. Conjunctivae and EOM are normal.  Neck: Normal range of motion. Neck supple. Carotid bruit is not present. No thyromegaly present.  Cardiovascular: Normal rate, regular rhythm, normal heart sounds and intact distal pulses.  Exam reveals no gallop and no friction rub.   No murmur heard. Pulmonary/Chest: Effort normal and breath sounds normal. She has no wheezes. She has no rales.  Abdominal: Soft. Bowel sounds are normal. She exhibits no distension and no mass. There is no tenderness. There is no rebound and no guarding.  Lymphadenopathy:       Head (right side): Submandibular and occipital adenopathy present. No tonsillar, no preauricular and no posterior auricular adenopathy present.       Head (left side): Submandibular adenopathy present. No tonsillar, no preauricular, no posterior auricular and no occipital adenopathy present.    She has cervical adenopathy.       Right cervical: Superficial cervical adenopathy present. No deep cervical and no posterior cervical adenopathy present.      Left cervical: Superficial cervical adenopathy present. No deep cervical and no posterior cervical adenopathy present.    She has no axillary adenopathy.       Right: No inguinal, no supraclavicular and no epitrochlear adenopathy present.       Left: No inguinal, no supraclavicular and no epitrochlear adenopathy present.  Neurological: She is alert and oriented to person, place, and time. No cranial nerve deficit.  Skin: Skin is warm and dry. No rash noted. She is not diaphoretic. No erythema. No pallor.  Large mounding skin lesion with normal color post-auricular region on LEFT.  Psychiatric: She has a normal mood and affect. Her behavior is normal.   No results found. Depression screen PHQ 2/9 09/12/2017  Decreased Interest 0  Down, Depressed, Hopeless 0  PHQ - 2 Score 0   Fall Risk  09/12/2017   Falls in the past year? No        Assessment & Plan:   1. Cervical lymphadenopathy   2. Localized swelling, mass or lump of neck   3. Irritant contact dermatitis due to cosmetics   4. Blood pressure elevated without history of HTN   5. Acute non-recurrent maxillary sinusitis     -New onset for the past two to three months.  Obtain labs; treat with Augmentin bid for ten days.  RTC for reevaluation; refer to CT neck. -borderline blood pressure; obtain labs; RTC one month for repeat BP.  Recommend weight loss, exercise, and low-sodium food choices. -new onset dermatitis due to new products; rx for Triamcinolone provided. -having some nasal congestion with thick sputum; will treat sinusitis with Augmentin.  Orders Placed This Encounter  Procedures  . CT Soft Tissue  Neck W Contrast    Standing Status:   Future    Standing Expiration Date:   12/13/2018    Order Specific Question:   If indicated for the ordered procedure, I authorize the administration of contrast media per Radiology protocol    Answer:   Yes    Order Specific Question:   Preferred imaging location?    Answer:   [facility]    Order Specific Question:   Radiology Contrast Protocol - do NOT remove file path    Answer:   \\[email or web address].[email or web address]  . CBC with Differential/Platelet  . Comprehensive metabolic panel  . Quantiferon tb gold assay (blood)  . Sedimentation Rate  . No Specimen Received  . Specimen status report  . Care order/instruction:    Please recheck BP.   Meds ordered this encounter  Medications  . amoxicillin-clavulanate (AUGMENTIN) 875-125 MG tablet    Sig: Take 1 tablet by mouth 2 (two) times daily.    Dispense:  20 tablet    Refill:  0  . loratadine (CLARITIN) 10 MG tablet    Sig: Take 1 tablet (10 mg total) by mouth daily.    Dispense:  30 tablet    Refill:  2  . triamcinolone cream (KENALOG) 0.1 %    Sig: Apply 1 application topically 2 (two) times daily.     Dispense:  30 g    Refill:  0    Return in about 4 weeks (around 10/10/2017) for recheck.   Alfonzia Woolum Elayne Guerin, M.D. Primary Care at [facility] previously [facility] [place] [address] [place], [place]  [address] [phone] phone [phone] fax

## 2017-09-12 NOTE — Patient Instructions (Addendum)
     IF you received an x-ray today, you will receive an invoice from [facility]. Please contact [facility] at [phone] with questions or concerns regarding your invoice.   IF you received labwork today, you will receive an invoice from [facility]. Please contact [facility] at [phone] with questions or concerns regarding your invoice.   Our billing staff will not be able to assist you with questions regarding bills from these companies.  You will be contacted with the lab results as soon as they are available. The fastest way to get your results is to activate your My Chart account. Instructions are located on the last page of this paperwork. If you have not heard from [place] regarding the results in 2 weeks, please contact this office.     Lymphadenopathy Lymphadenopathy refers to swollen or enlarged lymph glands, also called lymph nodes. Lymph glands are part of your body's defense (immune) system, which protects the body from infections, germs, and diseases. Lymph glands are found in many locations in your body, including the neck, underarm, and groin. Many things can cause lymph glands to become enlarged. When your immune system responds to germs, such as viruses or bacteria, infection-fighting cells and fluid build up. This causes the glands to grow in size. Usually, this is not something to worry about. The swelling and any soreness often go away without treatment. However, swollen lymph glands can also be caused by a number of diseases. Your health care provider may do various tests to help determine the cause. If the cause of your swollen lymph glands cannot be found, it is important to monitor your condition to make sure the swelling goes away. Follow these instructions at home: Watch your condition for any changes. The following actions may help to lessen any discomfort you are feeling:  Get plenty of rest.  Take medicines only as directed by your health care  provider. Your health care provider may recommend over-the-counter medicines for pain.  Apply moist heat compresses to the site of swollen lymph nodes as directed by your health care provider. This can help reduce any pain.  Check your lymph nodes daily for any changes.  Keep all follow-up visits as directed by your health care provider. This is important.  Contact a health care provider if:  Your lymph nodes are still swollen after 2 weeks.  Your swelling increases or spreads to other areas.  Your lymph nodes are hard, seem fixed to the skin, or are growing rapidly.  Your skin over the lymph nodes is red and inflamed.  You have a fever.  You have chills.  You have fatigue.  You develop a sore throat.  You have abdominal pain.  You have weight loss.  You have night sweats. Get help right away if:  You notice fluid leaking from the area of the enlarged lymph node.  You have severe pain in any area of your body.  You have chest pain.  You have shortness of breath. This information is not intended to replace advice given to you by your health care provider. Make sure you discuss any questions you have with your health care provider. Document Released: 08/24/2008 Document Revised: 04/22/2016 Document Reviewed: 06/20/2014 Elsevier Interactive Patient Education  [employer].

## 2017-09-13 LAB — COMPREHENSIVE METABOLIC PANEL
A/G RATIO: 1.7 (ref 1.2–2.2)
ALT: 34 IU/L — ABNORMAL HIGH (ref 0–32)
AST: 33 IU/L (ref 0–40)
Albumin: 4.8 g/dL (ref 3.6–4.8)
Alkaline Phosphatase: 87 IU/L (ref 39–117)
BILIRUBIN TOTAL: 0.3 mg/dL (ref 0.0–1.2)
BUN/Creatinine Ratio: 18 (ref 12–28)
BUN: 14 mg/dL (ref 8–27)
CALCIUM: 10.2 mg/dL (ref 8.7–10.3)
CHLORIDE: 102 mmol/L (ref 96–106)
CO2: 21 mmol/L (ref 20–29)
Creatinine, Ser: 0.8 mg/dL (ref 0.57–1.00)
GFR calc Af Amer: 92 mL/min/{1.73_m2} (ref >59)
GFR, EST NON AFRICAN AMERICAN: 80 mL/min/{1.73_m2} (ref >59)
Globulin, Total: 2.9 g/dL (ref 1.5–4.5)
Glucose: 97 mg/dL (ref 65–99)
Potassium: 4.5 mmol/L (ref 3.5–5.2)
Sodium: 141 mmol/L (ref 134–144)
Total Protein: 7.7 g/dL (ref 6.0–8.5)

## 2017-09-13 LAB — CBC WITH DIFFERENTIAL/PLATELET
BASOS: 1 %
Basophils Absolute: 0 10*3/uL (ref 0.0–0.2)
EOS (ABSOLUTE): 0.3 10*3/uL (ref 0.0–0.4)
Eos: 5 %
Hematocrit: 45.1 % (ref 34.0–46.6)
Hemoglobin: 14.8 g/dL (ref 11.1–15.9)
Immature Grans (Abs): 0 10*3/uL (ref 0.0–0.1)
Immature Granulocytes: 0 %
Lymphocytes Absolute: 1.5 10*3/uL (ref 0.7–3.1)
Lymphs: 24 %
MCH: 30.5 pg (ref 26.6–33.0)
MCHC: 32.8 g/dL (ref 31.5–35.7)
MCV: 93 fL (ref 79–97)
Monocytes Absolute: 0.5 10*3/uL (ref 0.1–0.9)
Monocytes: 8 %
Neutrophils Absolute: 4.1 10*3/uL (ref 1.4–7.0)
Neutrophils: 62 %
Platelets: 241 10*3/uL (ref 150–379)
RBC: 4.86 x10E6/uL (ref 3.77–5.28)
RDW: 13.7 % (ref 12.3–15.4)
WBC: 6.5 10*3/uL (ref 3.4–10.8)

## 2017-09-13 LAB — SEDIMENTATION RATE: Sed Rate: 40 mm/hr (ref 0–40)

## 2017-09-13 LAB — NO SPECIMEN RECEIVED

## 2017-09-15 LAB — QUANTIFERON TB GOLD ASSAY (BLOOD)

## 2017-09-21 ENCOUNTER — Ambulatory Visit
Admission: RE | Admit: 2017-09-21 | Discharge: 2017-09-21 | Disposition: A | Discharge status: Home / Self Care | Payer: BLUE CROSS/BLUE SHIELD | Source: Ambulatory Visit | Attending: Family Medicine | Admitting: Family Medicine

## 2017-09-21 DIAGNOSIS — R221 Localized swelling, mass and lump, neck: Secondary | ICD-10-CM

## 2017-09-21 LAB — QUANTIFERON-TB GOLD PLUS
QUANTIFERON-TB GOLD PLUS: NEGATIVE
QuantiFERON Mitogen Value: >10 IU/mL
QuantiFERON Nil Value: 0.12 IU/mL
QuantiFERON TB1 Ag Value: 0.12 IU/mL
QuantiFERON TB2 Ag Value: 0.11 IU/mL

## 2017-09-21 LAB — SPECIMEN STATUS REPORT

## 2017-09-21 MED ORDER — IOPAMIDOL (ISOVUE-300) INJECTION 61%
75.0000 mL | Freq: Once | INTRAVENOUS | Status: AC | PRN
  Administered 2017-09-21: 75 mL via INTRAVENOUS

## 2017-09-23 ENCOUNTER — Telehealth: Payer: Self-pay | Admitting: Family Medicine

## 2017-09-23 NOTE — Telephone Encounter (Signed)
Pt stated that she would like a call form someone to review her CT scan results with her..  Please advise

## 2017-09-24 NOTE — Telephone Encounter (Signed)
Pt is returning our call regarding her results.  Please advise

## 2017-09-24 NOTE — Telephone Encounter (Signed)
The patient returned my call, informed of CT results, she want to know if there is any meds. She can take to make her lymph node to shrink. She also ask me of lab. Results.

## 2017-09-24 NOTE — Telephone Encounter (Signed)
Called, the patient answer the phone, but she didn't want to talk with me, and hung up. Called again, and left message on AM to call back for CT results.

## 2017-09-25 NOTE — Telephone Encounter (Signed)
I prescribed patient Augmentin at her visit which would help with any sinus infection or tooth infection that may have caused her lymph nodes to swell.  I also provided Triamcinolone cream for skin rash that may have caused lymph nodes to swell.  Thus, at this time, there is no additional medication to take.  Labs all normal.

## 2017-09-27 NOTE — Telephone Encounter (Signed)
Called pt and LVM for her to give [place] a call back at the office.

## 2017-09-29 NOTE — Progress Notes (Signed)
IC pt, LMOVM with Dr. Thompson Caul message.  No further work up needed at this time, observe the lymph node and call us for an appt if there is any changes.    Pt wanted lab results.  Sent letter.

## 2017-10-10 ENCOUNTER — Encounter: Payer: Self-pay | Admitting: Family Medicine

## 2017-10-10 ENCOUNTER — Telehealth: Payer: Self-pay | Admitting: Family Medicine

## 2017-10-10 ENCOUNTER — Ambulatory Visit (INDEPENDENT_AMBULATORY_CARE_PROVIDER_SITE_OTHER): Payer: BLUE CROSS/BLUE SHIELD

## 2017-10-10 ENCOUNTER — Other Ambulatory Visit: Payer: Self-pay

## 2017-10-10 ENCOUNTER — Ambulatory Visit: Payer: BLUE CROSS/BLUE SHIELD | Admitting: Family Medicine

## 2017-10-10 VITALS — BP 122/82 | HR 91 | Temp 97.8°F | Resp 16 | Ht 64.17 in | Wt 188.0 lb

## 2017-10-10 DIAGNOSIS — K8020 Calculus of gallbladder without cholecystitis without obstruction: Secondary | ICD-10-CM

## 2017-10-10 DIAGNOSIS — R590 Localized enlarged lymph nodes: Secondary | ICD-10-CM | POA: Diagnosis not present

## 2017-10-10 DIAGNOSIS — N281 Cyst of kidney, acquired: Secondary | ICD-10-CM

## 2017-10-10 DIAGNOSIS — Z114 Encounter for screening for human immunodeficiency virus [HIV]: Secondary | ICD-10-CM | POA: Diagnosis not present

## 2017-10-10 DIAGNOSIS — J0100 Acute maxillary sinusitis, unspecified: Secondary | ICD-10-CM

## 2017-10-10 DIAGNOSIS — R945 Abnormal results of liver function studies: Secondary | ICD-10-CM | POA: Diagnosis not present

## 2017-10-10 DIAGNOSIS — R7989 Other specified abnormal findings of blood chemistry: Secondary | ICD-10-CM

## 2017-10-10 DIAGNOSIS — M7989 Other specified soft tissue disorders: Secondary | ICD-10-CM

## 2017-10-10 MED ORDER — BENZONATATE 100 MG PO CAPS: 100.0000 mg | ORAL_CAPSULE | Freq: Three times a day (TID) | ORAL | 0 refills | Status: DC | PRN

## 2017-10-10 MED ORDER — PREDNISONE 20 MG PO TABS: ORAL_TABLET | ORAL | 0 refills | Status: DC

## 2017-10-10 MED ORDER — DOXYCYCLINE HYCLATE 100 MG PO CAPS: 100.0000 mg | ORAL_CAPSULE | Freq: Two times a day (BID) | ORAL | 0 refills | Status: DC

## 2017-10-10 NOTE — Telephone Encounter (Signed)
Chest X-ray today- calling to make sure X-ray is visible in chart

## 2017-10-10 NOTE — Progress Notes (Signed)
Subjective:    Patient ID: Elizabeth Yoder, female    DOB: [birth year], 61 y.o.   MRN: [id number]  10/10/2017  Cough (follow up from 10/15 pt states she still has a cough and nasal congestion ) and Review CT scan    HPI This 61 y.o. female presents for one month follow-up of cervical lymphadenopathy for three months.  Management changes made at last visit include:  -New onset LAD for the past two to three months.  Obtain labs; treat with Augmentin bid for ten days.  RTC for reevaluation; refer to CT neck due to long duration of LAD. -borderline blood pressure; obtain labs; RTC one month for repeat BP.  Recommend weight loss, exercise, and low-sodium food choices. -new onset dermatitis due to new products; rx for Triamcinolone provided. -having some nasal congestion with thick sputum; will treat sinusitis with Augmentin.  CT soft tissue neck with contrast on 09/21/17: No evidence of submandibular region lymphadenopathy. Skin marker in the right posterior neck corresponds to a posterior triangle node measuring 9 mm in diameter. There is a mirror image node on the left that measures 6 mm. The nodal pattern is not particularly worrisome, and this may simply be due to some minor inflammation, possibly due to folliculitis or some other minor inflammatory process. Given the absence of a worrisome pattern, this could be observed clinically.  Completed Augmentin therapy.  Still coughing; not dry cough.  No fever/chills/sweats.  Mild head pressure; no real headache.  No ear pain.  No sore throat.  Mild rhinorrhea.  If leans over, water drops out of nare but not every day.  Nose feels runny upon awakening and in evening.  Blows nose twice daily and large amount comes out.  Taking Claritin. Having more congestion out of LEFT side.   Rarely ever has allergic rhinitis.  Unusual for patient to have fall allergies. Using blower to blow leaves every day.   Coughs all night long.  Not sure if has PND.   Takes Nyquil cough with cough drop.   R third finger PIP joint swelling.  No trauma.  Has been using the blower outside every day to blow leaves; may have overused R third finger to start blower.   Liver function tests were also elevated at last visit.  Patient underwent abdominal ultrasound in August 2015 due to elevation of liver function test at that time.  Abdominal ultrasound showed large gallstone within the gallbladder.  Sludge was also noted.  There was no gallbladder wall thickening at that time.  The liver also had increased echogenicity consistent with fatty liver.  There were no focal liver lesions.  There was also a left renal cyst present.   BP Readings from Last 3 Encounters:  10/10/17 122/82  09/12/17 (!) 142/88  07/07/14 124/70   Wt Readings from Last 3 Encounters:  10/10/17 188 lb (85.3 kg)  09/12/17 188 lb (85.3 kg)  07/07/14 182 lb 3.2 oz (82.6 kg)   Immunization History  Administered Date(s) Administered  . Td 06/20/2013    Review of Systems  Constitutional: Negative for chills, diaphoresis, fatigue and fever.  HENT: Positive for congestion, rhinorrhea and sinus pressure. Negative for ear pain, postnasal drip, sinus pain, sneezing, sore throat, tinnitus, trouble swallowing and voice change.   Eyes: Negative for visual disturbance.  Respiratory: Positive for cough. Negative for shortness of breath.   Cardiovascular: Negative for chest pain, palpitations and leg swelling.  Gastrointestinal: Negative for abdominal pain, constipation, diarrhea, nausea and vomiting.  Endocrine: Negative for cold intolerance, heat intolerance, polydipsia, polyphagia and polyuria.  Musculoskeletal: Positive for arthralgias and joint swelling.  Neurological: Negative for dizziness, tremors, seizures, syncope, facial asymmetry, speech difficulty, weakness, light-headedness, numbness and headaches.  Hematological: Positive for adenopathy.    History reviewed. No pertinent past medical  history. Past Surgical History:  Procedure Laterality Date  . TUBAL LIGATION     No Known Allergies Current Outpatient Medications on File Prior to Visit  Medication Sig Dispense Refill  . loratadine (CLARITIN) 10 MG tablet Take 1 tablet (10 mg total) by mouth daily. 30 tablet 2  . triamcinolone cream (KENALOG) 0.1 % Apply 1 application topically 2 (two) times daily. 30 g 0   No current facility-administered medications on file prior to visit.    Social History   Socioeconomic History  . Marital status: Divorced    Spouse name: Not on file  . Number of children: 1  . Years of education: Not on file  . Highest education level: Not on file  Social Needs  . Financial resource strain: Not on file  . Food insecurity - worry: Not on file  . Food insecurity - inability: Not on file  . Transportation needs - medical: Not on file  . Transportation needs - non-medical: Not on file  Occupational History  . Occupation: [employer] work  Tobacco Use  . Smoking status: Never Smoker  . Smokeless tobacco: Never Used  Substance and Sexual Activity  . Alcohol use: No  . Drug use: No  . Sexual activity: Yes    Birth control/protection: Post-menopausal  Other Topics Concern  . Not on file  Social History Narrative   Marital status: divorced in 2009; not dating in 2018      Children: 1 son; 2 grandchildren; 1 gg.       Lives: with son       Employment;  [employer] work in 2018      Tobacco: none      Alcohol: none      Drugs: none         Family History  Problem Relation Age of Onset  . Hypertension Mother   . Heart disease Mother 52       mild AMI/no stenting  . Cancer Father        unknown cancer? stomach? tobacco  . Heart disease Father 44       AMI as cause of death  . Hypertension Sister   . Hyperlipidemia Sister        Objective:    BP 122/82   Pulse 91   Temp 97.8 F (36.6 C) (Oral)   Resp 16   Ht 5' 4.17" (1.63 m)   Wt 188 lb (85.3 kg)   SpO2 95%   BMI 32.10 kg/m   Physical Exam  Constitutional: She is oriented to person, place, and time. She appears well-developed and well-nourished. No distress.  HENT:  Head: Normocephalic and atraumatic.  Right Ear: External ear normal.  Left Ear: External ear normal.  Nose: Nose normal.  Mouth/Throat: Oropharynx is clear and moist.  Eyes: Conjunctivae and EOM are normal. Pupils are equal, round, and reactive to light.  Neck: Normal range of motion. Neck supple. Carotid bruit is not present. No thyromegaly present.  Cardiovascular: Normal rate, regular rhythm, normal heart sounds and intact distal pulses. Exam reveals no gallop and no friction rub.  No murmur heard. Pulmonary/Chest: Effort normal and breath sounds normal. She has no wheezes. She has no rales.  Abdominal: Soft. Bowel sounds are normal. She exhibits no distension and no mass. There is no tenderness. There is no rebound and no guarding.  Musculoskeletal:  R middle finger with localized swelling at PIP joint with painful ROM with extension and flexion.  No warmth to joint; no streaking. No fluctuance.  Lymphadenopathy:    She has cervical adenopathy.       Right cervical: Posterior cervical adenopathy present.       Left cervical: Posterior cervical adenopathy present.       Right: No inguinal, no supraclavicular and no epitrochlear adenopathy present.       Left: No inguinal, no supraclavicular and no epitrochlear adenopathy present.  Neurological: She is alert and oriented to person, place, and time. No cranial nerve deficit.  Skin: Skin is warm and dry. No rash noted. She is not diaphoretic. No erythema. No pallor.  Psychiatric: She has a normal mood and affect. Her behavior is normal.   No results found. Depression screen [facility] 2/9 10/10/2017 09/12/2017  Decreased Interest 0 0  Down, Depressed, Hopeless 0 0  PHQ - 2 Score 0 0   Fall Risk  10/10/2017 09/12/2017  Falls in the past year? No No        Assessment & Plan:   1. Posterior  cervical lymphadenopathy   2. Acute non-recurrent maxillary sinusitis   3. Elevated liver function tests   4. Renal cyst   5. Calculus of gallbladder without cholecystitis without obstruction   6. Swelling of right middle finger   7. Screening for HIV (human immunodeficiency virus)    Persistent posterior cervical lymphadenopathy.  Patient status post CT of the neck that showed sub-centimeter sized lymph nodes bilaterally.  Observation was recommended by radiologist.  Patient very concerned with the persistence of these enlarged lymph nodes over the past 3 months.  Referral to ear nose and throat provider will be placed today.  Patient also suffering with persistent sinusitis symptoms with cough.  Prescription for doxycycline, prednisone, and Tessalon Perles provided to treat sinusitis may be residual.  Obtain chest x-ray for further evaluation as well.  Patient also suffering with right middle finger swelling.  Recent overuse with gardening tools.  Recommend rest, avoiding repetitive motion with blower.  No other joint involvement at this time.  Chronic elevated liver function test.  Previous abdominal ultrasound that revealed nonalcoholic fatty liver.  Will repeat elevated liver function test today.  May warrant repeat abdominal ultrasound or CT of the abdomen to further evaluate.  Consider referral to gastroenterology if elevated liver function test persist.  Recommend avoiding alcohol and Tylenol-containing products.  History of renal cyst present on abdominal ultrasound.  Consider repeating imaging to follow-up on this renal cyst however normally benign etiology.   Orders Placed This Encounter  Procedures  . DG Finger Middle Right    Standing Status:   Future    Number of Occurrences:   1    Standing Expiration Date:   10/10/2018    Order Specific Question:   Reason for Exam (SYMPTOM  OR DIAGNOSIS REQUIRED)    Answer:   pain and swelling at PIP joint    Order Specific Question:    Preferred imaging location?    Answer:   External  . DG Chest 2 View    Standing Status:   Future    Number of Occurrences:   1    Standing Expiration Date:   10/10/2018    Order Specific Question:   Reason for  Exam (SYMPTOM  OR DIAGNOSIS REQUIRED)    Answer:   cough, cervical LAD    Order Specific Question:   Preferred imaging location?    Answer:   External  . Comprehensive metabolic panel  . Sedimentation Rate  . Acute Hep Panel & Hep B Surface Ab  . Ambulatory referral to ENT    Referral Priority:   Routine    Referral Type:   Consultation    Referral Reason:   Specialty Services Required    Requested Specialty:   Otolaryngology    Number of Visits Requested:   1   Meds ordered this encounter  Medications  . doxycycline (VIBRAMYCIN) 100 MG capsule    Sig: Take 1 capsule (100 mg total) 2 (two) times daily by mouth.    Dispense:  20 capsule    Refill:  0  . predniSONE (DELTASONE) 20 MG tablet    Sig: Three tablets daily x 3 days    Dispense:  9 tablet    Refill:  0  . benzonatate (TESSALON) 100 MG capsule    Sig: Take 1-2 capsules (100-200 mg total) 3 (three) times daily as needed by mouth for cough.    Dispense:  60 capsule    Refill:  0    Return in about 6 weeks (around 11/21/2017) for follow-up chronic medical conditions, recheck.   Elizabeth Yoder, M.D. Primary Care at [facility] previously [facility] [place] [address] [place], [place]  [address] [phone] phone [phone] fax

## 2017-10-10 NOTE — Patient Instructions (Addendum)
   Recommend dental evaluation. Continue taking Claritin daily.  Take Doxycycline and Prednisone. We will call you in upcoming few weeks with referral to [facility] provider.   IF you received an x-ray today, you will receive an invoice from [facility]. Please contact [facility] at [phone] with questions or concerns regarding your invoice.   IF you received labwork today, you will receive an invoice from [facility]. Please contact [facility] at [phone] with questions or concerns regarding your invoice.   Our billing staff will not be able to assist you with questions regarding bills from these companies.  You will be contacted with the lab results as soon as they are available. The fastest way to get your results is to activate your My Chart account. Instructions are located on the last page of this paperwork. If you have not heard from [place] regarding the results in 2 weeks, please contact this office.

## 2017-10-11 ENCOUNTER — Other Ambulatory Visit: Payer: Self-pay | Admitting: Family Medicine

## 2017-10-11 DIAGNOSIS — R911 Solitary pulmonary nodule: Secondary | ICD-10-CM

## 2017-10-11 DIAGNOSIS — R945 Abnormal results of liver function studies: Secondary | ICD-10-CM

## 2017-10-11 LAB — COMPREHENSIVE METABOLIC PANEL
A/G RATIO: 1.6 (ref 1.2–2.2)
ALK PHOS: 88 IU/L (ref 39–117)
ALT: 36 IU/L — ABNORMAL HIGH (ref 0–32)
AST: 33 IU/L (ref 0–40)
Albumin: 4.6 g/dL (ref 3.6–4.8)
BUN/Creatinine Ratio: 18 (ref 12–28)
BUN: 14 mg/dL (ref 8–27)
Bilirubin Total: 0.3 mg/dL (ref 0.0–1.2)
CALCIUM: 9.8 mg/dL (ref 8.7–10.3)
CO2: 21 mmol/L (ref 20–29)
Chloride: 102 mmol/L (ref 96–106)
Creatinine, Ser: 0.8 mg/dL (ref 0.57–1.00)
GFR calc Af Amer: 92 mL/min/{1.73_m2} (ref >59)
GFR, EST NON AFRICAN AMERICAN: 80 mL/min/{1.73_m2} (ref >59)
GLOBULIN, TOTAL: 2.8 g/dL (ref 1.5–4.5)
Glucose: 91 mg/dL (ref 65–99)
POTASSIUM: 4.5 mmol/L (ref 3.5–5.2)
Sodium: 141 mmol/L (ref 134–144)
Total Protein: 7.4 g/dL (ref 6.0–8.5)

## 2017-10-11 LAB — ACUTE HEP PANEL AND HEP B SURFACE AB
HEP A IGM: NEGATIVE
Hep B C IgM: NEGATIVE
Hep C Virus Ab: <0.1 s/co ratio (ref 0.0–0.9)
Hepatitis B Surf Ab Quant: 5.1 m[IU]/mL — ABNORMAL LOW (ref >9.9)
Hepatitis B Surface Ag: NEGATIVE

## 2017-10-11 LAB — SEDIMENTATION RATE: Sed Rate: 47 mm/h — ABNORMAL HIGH (ref 0–40)

## 2017-10-19 ENCOUNTER — Telehealth: Payer: Self-pay | Admitting: Family Medicine

## 2017-10-19 DIAGNOSIS — R7989 Other specified abnormal findings of blood chemistry: Secondary | ICD-10-CM

## 2017-10-19 DIAGNOSIS — R945 Abnormal results of liver function studies: Principal | ICD-10-CM

## 2017-10-19 NOTE — Telephone Encounter (Signed)
Pt is scheduled to have CT Chest W Contrast and CT Abdomen and Pelvis W Contrast on 10/25/17. The CT Chest has been approved with [employer] but CT Abdomen and Pelvis has not. The CT Abdomen and Pelvis is under review with [employer] until 10/25/17. A peer to peer could be done before this if desired by provider by calling [employer] at [phone]. They are saying this does not meet medical necessity due to no nondiagnostic U/S performed. I left a vm for Dorian Pod at [place] to let her know this has not been approved yet in case they need to cancel pt's appt for this CT until they receive prior auth. I told her I will let her know how we will proceed once I hear. Thanks!

## 2017-10-25 ENCOUNTER — Other Ambulatory Visit: Payer: BLUE CROSS/BLUE SHIELD

## 2017-10-25 ENCOUNTER — Ambulatory Visit
Admission: RE | Admit: 2017-10-25 | Discharge: 2017-10-25 | Disposition: A | Discharge status: Home / Self Care | Payer: BLUE CROSS/BLUE SHIELD | Source: Ambulatory Visit | Attending: Family Medicine | Admitting: Family Medicine

## 2017-10-25 DIAGNOSIS — R911 Solitary pulmonary nodule: Secondary | ICD-10-CM

## 2017-10-25 MED ORDER — IOPAMIDOL (ISOVUE-300) INJECTION 61%
75.0000 mL | Freq: Once | INTRAVENOUS | Status: AC | PRN
  Administered 2017-10-25: 75 mL via INTRAVENOUS

## 2017-10-26 NOTE — Telephone Encounter (Signed)
Dorian Pod from [place] called and left a vm yesterday (10/25/17) to check in on pt CT Abdomen and Pelvis w/ Contrast. She had the pt on the phone and was getting ready to cancel the CT Abdomen and Pelvis due to insurance not covering this. She wanted to know if Dr. Tamala Julian had decided to do a peer to peer or if she wanted to order an U/S first. The pt did request for [place] to call her to let her know the status of this and how Dr. Tamala Julian would like to proceed. She ended up having the CT Chest done yesterday but canceled the CT Abdomen and Pelvis. Please advise. Pt can be reached at [phone]. Thanks!

## 2017-10-27 NOTE — Telephone Encounter (Signed)
Call Elizabeth Yoder and patient ---- I have ordered abdominal ultrasound to evaluate liver since insurance is denying CT abdomen and pelvis.  I apologize for delay; I was out of town for Thanksgiving holiday.

## 2017-11-03 ENCOUNTER — Ambulatory Visit
Admission: RE | Admit: 2017-11-03 | Discharge: 2017-11-03 | Disposition: A | Discharge status: Home / Self Care | Payer: BLUE CROSS/BLUE SHIELD | Source: Ambulatory Visit | Attending: Family Medicine | Admitting: Family Medicine

## 2017-11-03 DIAGNOSIS — R945 Abnormal results of liver function studies: Principal | ICD-10-CM

## 2017-11-03 DIAGNOSIS — R7989 Other specified abnormal findings of blood chemistry: Secondary | ICD-10-CM

## 2017-11-10 ENCOUNTER — Ambulatory Visit (INDEPENDENT_AMBULATORY_CARE_PROVIDER_SITE_OTHER): Payer: BLUE CROSS/BLUE SHIELD

## 2017-11-10 ENCOUNTER — Encounter: Payer: Self-pay | Admitting: Podiatry

## 2017-11-10 ENCOUNTER — Telehealth: Payer: Self-pay | Admitting: Family Medicine

## 2017-11-10 ENCOUNTER — Other Ambulatory Visit: Payer: Self-pay | Admitting: Podiatry

## 2017-11-10 ENCOUNTER — Ambulatory Visit: Payer: BLUE CROSS/BLUE SHIELD | Admitting: Podiatry

## 2017-11-10 DIAGNOSIS — M779 Enthesopathy, unspecified: Secondary | ICD-10-CM | POA: Diagnosis not present

## 2017-11-10 DIAGNOSIS — M25571 Pain in right ankle and joints of right foot: Secondary | ICD-10-CM

## 2017-11-10 DIAGNOSIS — S99921A Unspecified injury of right foot, initial encounter: Secondary | ICD-10-CM

## 2017-11-10 MED ORDER — TRIAMCINOLONE ACETONIDE 10 MG/ML IJ SUSP
10.0000 mg | Freq: Once | INTRAMUSCULAR | Status: AC
  Administered 2017-11-10: 10 mg

## 2017-11-10 NOTE — Progress Notes (Signed)
Subjective:   Patient ID: Elizabeth Yoder, female   DOB: 61 y.o.   MRN: [id number]   HPI Patient presents stating I am having a lot of pain in my right foot and it was traumatized several months ago and then my right ankle started to hurt and also had discoloration of my big toenail left foot.  Patient states it has been going on for about 3 months and has gradually gotten more aggravating over that period of time.   Review of Systems  All other systems reviewed and are negative.       Objective:  Physical Exam  Constitutional: She appears well-developed and well-nourished.  Cardiovascular: Intact distal pulses.  Musculoskeletal: Normal range of motion.  Neurological: She is alert.  Skin: Skin is warm.  Nursing note and vitals reviewed.   Neurovascular status intact muscle strength adequate range of motion within normal limits with patient noted to have inflammation pain of the third MPJ right with also swelling of the toe and discomfort of the posterior tibial tendon right with difficulty making complete determination as to whether or not there might be moderate dysfunction upon muscle testing.  There is swelling in the more proximal portion of the tendon underneath the medial malleolus.  Discoloration of the left hallux nail also noted and patient was found to not smoke currently and would like to be more active.     Assessment:  Inflammatory capsulitis with trauma of the third MPJ right with traumatized third toe posterior tibial tendinitis right with possibility of interstitial tear and mycotic nail infection left.     Plan:  H&P x-rays reviewed all conditions discussed.  At this time I did a proximal nerve block of the right forefoot I aspirated the third MPJ getting a small amount of fluid that had some bloody infiltrate indicating probable tear from traumatic injury and injected carefully with quarter cc dexamethasone Kenalog and around the third digit and applied padding.  I  dispensed her fracture walker treatment immobilize the ankle and instructed on physical therapy and begin topical medicines for the left hallux.  Patient also will begin oral anti-inflammatory treatment and will be seen back to recheck again in the next 3 weeks and may require orthotics or other treatments.  X-rays indicate no indications of fracture of the metatarsal or digit with moderate depression of the arch noted

## 2017-11-10 NOTE — Telephone Encounter (Signed)
Copied from [place] #[id number]. Topic: Quick Communication - See Telephone Encounter >> Nov 10, 2017  3:37 PM Bea Graff, NT wrote: CRM for notification. See Telephone encounter for: Pt would like to xray and [place] results that she had done last month and on the 6th. Please advise.  11/10/17.

## 2017-11-12 NOTE — Telephone Encounter (Signed)
Please advise 

## 2017-11-13 NOTE — Telephone Encounter (Signed)
Call --- 1. Abdominal ultrasound shows gallstones without evidence of gallbladder infection or inflammation.  Fatty liver also present due to carrying extra weight in abdominal region; recommend weight loss, exercise, and low fat food choices.  Left kidney with a cyst present that is unchanged.   2.  CT chest shows a mild scar on the RIGHT lung, small hiatal hernia.  No other abnormalities seen.

## 2017-11-15 ENCOUNTER — Other Ambulatory Visit: Payer: Self-pay | Admitting: Family Medicine

## 2017-11-15 MED ORDER — BENZONATATE 100 MG PO CAPS: 100.0000 mg | ORAL_CAPSULE | Freq: Three times a day (TID) | ORAL | 0 refills | Status: DC | PRN

## 2017-11-15 NOTE — Telephone Encounter (Signed)
Pt would like to know if she can take over the counter detox for liver. Pt states she is still coughing as well. States she is out of her tessalon pearls and would like to know if she can continue. Pt would also like to be prescribed ambien.

## 2017-11-15 NOTE — Telephone Encounter (Signed)
I would not recommend over the counter liver detox. I would recommend weight loss and low fat food choices.    Regarding cough, when is appointment with ENT?  I would discuss cough with ENT.  I can send in refill of Tessalon Perles.  Regarding request for Ambien, we will need to discuss at next visit in January (as planned at her last visit).  Please have patient schedule follow-up visit with me in January 2019.

## 2017-11-16 NOTE — Telephone Encounter (Signed)
LVM advising pt.

## 2017-11-28 ENCOUNTER — Ambulatory Visit: Payer: BLUE CROSS/BLUE SHIELD | Admitting: Podiatry

## 2017-12-02 ENCOUNTER — Ambulatory Visit: Payer: BLUE CROSS/BLUE SHIELD | Admitting: Family Medicine

## 2017-12-02 ENCOUNTER — Other Ambulatory Visit: Payer: Self-pay

## 2017-12-02 ENCOUNTER — Ambulatory Visit: Payer: BLUE CROSS/BLUE SHIELD | Admitting: Podiatry

## 2017-12-02 ENCOUNTER — Encounter: Payer: Self-pay | Admitting: Family Medicine

## 2017-12-02 VITALS — BP 117/80 | HR 96 | Temp 99.7°F | Resp 16 | Ht 64.17 in | Wt 184.2 lb

## 2017-12-02 DIAGNOSIS — J209 Acute bronchitis, unspecified: Secondary | ICD-10-CM

## 2017-12-02 DIAGNOSIS — R062 Wheezing: Secondary | ICD-10-CM

## 2017-12-02 MED ORDER — PREDNISONE 20 MG PO TABS: 40.0000 mg | ORAL_TABLET | Freq: Every day | ORAL | 0 refills | Status: AC

## 2017-12-02 MED ORDER — ALBUTEROL SULFATE HFA 108 (90 BASE) MCG/ACT IN AERS: 2.0000 | INHALATION_SPRAY | Freq: Four times a day (QID) | RESPIRATORY_TRACT | 0 refills | Status: DC | PRN

## 2017-12-02 MED ORDER — AZITHROMYCIN 250 MG PO TABS: ORAL_TABLET | ORAL | 0 refills | Status: DC

## 2017-12-02 NOTE — Progress Notes (Signed)
Chief Complaint  Patient presents with  . URI    onset: x 8 days, no energy, coughing up phlegm, achy and night sweats, no fevers, pee pee's in pants when she coughs, joint pain, pain in breast area when leaning forward, right side pain with leaning.  Only sleeping 3 hours a night.  Phlegm is white and clear with a tinge of green.  Took tylenol/aleve and it caused swelling in pinky finger left hand and middle finger in right hand    HPI   Pt with 8 days of URI symptoms Reports that she has been coughing with sputum that is white sometimes green She reports that she has coughing and took mucinex for the cough as well as nyquil and vicks She states that she has been having a month of fatigue She reports that when she leans over she has been having chest pain that is pleuritic and worse when she leans forward She has low grade fevers    No past medical history on file.  Current Outpatient Medications  Medication Sig Dispense Refill  . albuterol (PROVENTIL HFA;VENTOLIN HFA) 108 (90 Base) MCG/ACT inhaler Inhale 2 puffs into the lungs every 6 (six) hours as needed for wheezing or shortness of breath. 1 Inhaler 0  . azithromycin (ZITHROMAX) 250 MG tablet Take 2 tabs on day 1 then one tablet each day after 6 tablet 0  . benzonatate (TESSALON) 100 MG capsule Take 1-2 capsules (100-200 mg total) by mouth 3 (three) times daily as needed for cough. (Patient not taking: Reported on 12/02/2017) 60 capsule 0  . doxycycline (VIBRAMYCIN) 100 MG capsule Take 1 capsule (100 mg total) 2 (two) times daily by mouth. (Patient not taking: Reported on 12/02/2017) 20 capsule 0  . loratadine (CLARITIN) 10 MG tablet Take 1 tablet (10 mg total) by mouth daily. (Patient not taking: Reported on 12/02/2017) 30 tablet 2  . predniSONE (DELTASONE) 20 MG tablet Take 2 tablets (40 mg total) by mouth daily with breakfast for 5 days. 10 tablet 0  . triamcinolone cream (KENALOG) 0.1 % Apply 1 application topically 2 (two) times  daily. (Patient not taking: Reported on 12/02/2017) 30 g 0   No current facility-administered medications for this visit.     Allergies: No Known Allergies  Past Surgical History:  Procedure Laterality Date  . TUBAL LIGATION      Social History   Socioeconomic History  . Marital status: Divorced    Spouse name: None  . Number of children: 1  . Years of education: None  . Highest education level: None  Social Needs  . Financial resource strain: None  . Food insecurity - worry: None  . Food insecurity - inability: None  . Transportation needs - medical: None  . Transportation needs - non-medical: None  Occupational History  . Occupation: [employer] work  Tobacco Use  . Smoking status: Never Smoker  . Smokeless tobacco: Never Used  Substance and Sexual Activity  . Alcohol use: No  . Drug use: No  . Sexual activity: Yes    Birth control/protection: Post-menopausal  Other Topics Concern  . None  Social History Narrative   Marital status: divorced in 2009; not dating in 2018      Children: 1 son; 2 grandchildren; 1 gg.       Lives: with son       Employment;  [employer] work in 2018      Tobacco: none      Alcohol: none  Drugs: none          Family History  Problem Relation Age of Onset  . Hypertension Mother   . Heart disease Mother 71       mild AMI/no stenting  . Cancer Father        unknown cancer? stomach? tobacco  . Heart disease Father 97       AMI as cause of death  . Hypertension Sister   . Hyperlipidemia Sister      ROS Review of Systems See HPI Constitution: +fevers and chills at home No malaise No diaphoresis Skin: No rash or itching Eyes: no blurry vision, no double vision GU: no dysuria or hematuria Neuro: no dizziness or headaches all others reviewed and negative   Objective: Vitals:   12/02/17 1156  BP: 117/80  Pulse: 96  Resp: 16  Temp: 99.7 F (37.6 C)  TempSrc: Oral  SpO2: 94%  Weight: 184 lb 3.2 oz (83.6 kg)  Height: 5'  4.17" (1.63 m)    Physical Exam General: alert, oriented, in NAD Head: normocephalic, atraumatic, no sinus tenderness Eyes: EOM intact, no scleral icterus or conjunctival injection Ears: TM clear bilaterally Nose: mucosa nonerythematous, nonedematous Throat: no pharyngeal exudate or erythema Lymph: no posterior auricular, submental or cervical lymph adenopathy Heart: normal rate, normal sinus rhythm, no murmurs Lungs: poor air movement noted, wheezing in left base, after coughing air movement improved   CLINICAL DATA:  Possible pulmonary nodule on plain films of the chest.  EXAM: CT CHEST WITH CONTRAST  TECHNIQUE: Multidetector CT imaging of the chest was performed during intravenous contrast administration.  CONTRAST:  75 ml ISOVUE-300 IOPAMIDOL (ISOVUE-300) INJECTION 61%  COMPARISON:  PA and lateral chest 10/10/2017.  FINDINGS: Cardiovascular: Heart size is normal. No pericardial effusion. No calcific atherosclerosis. No aneurysm.  Mediastinum/Nodes: Small hiatal hernia noted. No lymphadenopathy. Thyroid appears normal.  Lungs/Pleura: No pulmonary nodule is identified. Finding on plain films is likely due to a pulmonary vein on the right. Mild scar is seen in the periphery of the right middle lobe and adjacent right lower lobe. Small focus of scar is also noted in the lingula.  Upper Abdomen: There is fatty infiltration of the liver. Partial visualization of gallstone without evidence of cholecystitis.  Musculoskeletal: Negative.  IMPRESSION: Negative for pulmonary nodule. No evidence neoplastic process. Finding on plain films likely due to a pulmonary vein.  Mild scar in the periphery of the right middle lobe and adjacent right lower lobe may be due to prior infection.  Small hiatal hernia.  Fatty infiltration of the liver.  Gallstones.   Electronically Signed   By: Inge Rise M.D.   On: 10/26/2017 08:47   Assessment and  Plan Krystel was seen today for uri.  Diagnoses and all orders for this visit:  Wheezing Bronchitis, acute, with bronchospasm Will treat with albuterol, steroid and zithromax for presumed CAP Since pt just had numerous cxr and ct will treat empirically with close follow up She was advised to take her meds daily Albuterol teaching was done Rest and hydration advised  -     predniSONE (DELTASONE) 20 MG tablet; Take 2 tablets (40 mg total) by mouth daily with breakfast for 5 days. -     azithromycin (ZITHROMAX) 250 MG tablet; Take 2 tabs on day 1 then one tablet each day after -     albuterol (PROVENTIL HFA;VENTOLIN HFA) 108 (90 Base) MCG/ACT inhaler; Inhale 2 puffs into the lungs every 6 (six) hours as needed for  wheezing or shortness of breath.     [place]

## 2017-12-02 NOTE — Patient Instructions (Addendum)
IF you received an x-ray today, you will receive an invoice from [facility]. Please contact [facility] at [phone] with questions or concerns regarding your invoice.   IF you received labwork today, you will receive an invoice from [place]. Please contact [facility] at [phone] with questions or concerns regarding your invoice.   Our billing staff will not be able to assist you with questions regarding bills from these companies.  You will be contacted with the lab results as soon as they are available. The fastest way to get your results is to activate your My Chart account. Instructions are located on the last page of this paperwork. If you have not heard from [place] regarding the results in 2 weeks, please contact this office.     Acute Bronchitis, Adult Acute bronchitis is sudden (acute) swelling of the air tubes (bronchi) in the lungs. Acute bronchitis causes these tubes to fill with mucus, which can make it hard to breathe. It can also cause coughing or wheezing. In adults, acute bronchitis usually goes away within 2 weeks. A cough caused by bronchitis may last up to 3 weeks. Smoking, allergies, and asthma can make the condition worse. Repeated episodes of bronchitis may cause further lung problems, such as chronic obstructive pulmonary disease (COPD). What are the causes? This condition can be caused by germs and by substances that irritate the lungs, including:  Cold and flu viruses. This condition is most often caused by the same virus that causes a cold.  Bacteria.  Exposure to tobacco smoke, dust, fumes, and air pollution.  What increases the risk? This condition is more likely to develop in people who:  Have close contact with someone with acute bronchitis.  Are exposed to lung irritants, such as tobacco smoke, dust, fumes, and vapors.  Have a weak immune system.  Have a respiratory condition such as asthma.  What are the signs or  symptoms? Symptoms of this condition include:  A cough.  Coughing up clear, yellow, or green mucus.  Wheezing.  Chest congestion.  Shortness of breath.  A fever.  Body aches.  Chills.  A sore throat.  How is this diagnosed? This condition is usually diagnosed with a physical exam. During the exam, your health care provider may order tests, such as chest X-rays, to rule out other conditions. He or she may also:  Test a sample of your mucus for bacterial infection.  Check the level of oxygen in your blood. This is done to check for pneumonia.  Do a chest X-ray or lung function testing to rule out pneumonia and other conditions.  Perform blood tests.  Your health care provider will also ask about your symptoms and medical history. How is this treated? Most cases of acute bronchitis clear up over time without treatment. Your health care provider may recommend:  Drinking more fluids. Drinking more makes your mucus thinner, which may make it easier to breathe.  Taking a medicine for a fever or cough.  Taking an antibiotic medicine.  Using an inhaler to help improve shortness of breath and to control a cough.  Using a cool mist vaporizer or humidifier to make it easier to breathe.  Follow these instructions at home: Medicines  Take over-the-counter and prescription medicines only as told by your health care provider.  If you were prescribed an antibiotic, take it as told by your health care provider. Do not stop taking the antibiotic even if you start to feel better. General instructions  Get plenty of rest.  Drink enough fluids to keep your urine clear or pale yellow.  Avoid smoking and secondhand smoke. Exposure to cigarette smoke or irritating chemicals will make bronchitis worse. If you smoke and you need help quitting, ask your health care provider. Quitting smoking will help your lungs heal faster.  Use an inhaler, cool mist vaporizer, or humidifier as told  by your health care provider.  Keep all follow-up visits as told by your health care provider. This is important. How is this prevented? To lower your risk of getting this condition again:  Wash your hands often with soap and water. If soap and water are not available, use hand sanitizer.  Avoid contact with people who have cold symptoms.  Try not to touch your hands to your mouth, nose, or eyes.  Make sure to get the flu shot every year.  Contact a health care provider if:  Your symptoms do not improve in 2 weeks of treatment. Get help right away if:  You cough up blood.  You have chest pain.  You have severe shortness of breath.  You become dehydrated.  You faint or keep feeling like you are going to faint.  You keep vomiting.  You have a severe headache.  Your fever or chills gets worse. This information is not intended to replace advice given to you by your health care provider. Make sure you discuss any questions you have with your health care provider. Document Released: 12/23/2004 Document Revised: 06/09/2016 Document Reviewed: 05/05/2016 Elsevier Interactive Patient Education  [employer].

## 2017-12-05 ENCOUNTER — Encounter: Payer: Self-pay | Admitting: Family Medicine

## 2017-12-05 ENCOUNTER — Other Ambulatory Visit: Payer: Self-pay

## 2017-12-05 ENCOUNTER — Ambulatory Visit: Payer: BLUE CROSS/BLUE SHIELD | Admitting: Family Medicine

## 2017-12-05 VITALS — BP 138/84 | HR 88 | Temp 98.6°F | Resp 16 | Ht 64.17 in | Wt 187.6 lb

## 2017-12-05 DIAGNOSIS — J209 Acute bronchitis, unspecified: Secondary | ICD-10-CM | POA: Diagnosis not present

## 2017-12-05 NOTE — Progress Notes (Signed)
Chief Complaint  Patient presents with  . recheck bronchitis    feeling better per pt    HPI  Pt took zpak and prednisone and she feels much better Wheezing stopped No nausea or vomiting Has some fluid retention from the prednisone Occasional productive cough   No past medical history on file.  Current Outpatient Medications  Medication Sig Dispense Refill  . albuterol (PROVENTIL HFA;VENTOLIN HFA) 108 (90 Base) MCG/ACT inhaler Inhale 2 puffs into the lungs every 6 (six) hours as needed for wheezing or shortness of breath. 1 Inhaler 0  . azithromycin (ZITHROMAX) 250 MG tablet Take 2 tabs on day 1 then one tablet each day after 6 tablet 0  . benzonatate (TESSALON) 100 MG capsule Take 1-2 capsules (100-200 mg total) by mouth 3 (three) times daily as needed for cough. (Patient not taking: Reported on 12/02/2017) 60 capsule 0  . doxycycline (VIBRAMYCIN) 100 MG capsule Take 1 capsule (100 mg total) 2 (two) times daily by mouth. (Patient not taking: Reported on 12/02/2017) 20 capsule 0  . loratadine (CLARITIN) 10 MG tablet Take 1 tablet (10 mg total) by mouth daily. (Patient not taking: Reported on 12/02/2017) 30 tablet 2  . predniSONE (DELTASONE) 20 MG tablet Take 2 tablets (40 mg total) by mouth daily with breakfast for 5 days. 10 tablet 0  . triamcinolone cream (KENALOG) 0.1 % Apply 1 application topically 2 (two) times daily. (Patient not taking: Reported on 12/02/2017) 30 g 0   No current facility-administered medications for this visit.     Allergies: No Known Allergies  Past Surgical History:  Procedure Laterality Date  . TUBAL LIGATION      Social History   Socioeconomic History  . Marital status: Divorced    Spouse name: None  . Number of children: 1  . Years of education: None  . Highest education level: None  Social Needs  . Financial resource strain: None  . Food insecurity - worry: None  . Food insecurity - inability: None  . Transportation needs - medical: None  .  Transportation needs - non-medical: None  Occupational History  . Occupation: [employer] work  Tobacco Use  . Smoking status: Never Smoker  . Smokeless tobacco: Never Used  Substance and Sexual Activity  . Alcohol use: No  . Drug use: No  . Sexual activity: Yes    Birth control/protection: Post-menopausal  Other Topics Concern  . None  Social History Narrative   Marital status: divorced in 2009; not dating in 2018      Children: 1 son; 2 grandchildren; 1 gg.       Lives: with son       Employment;  [employer] work in 2018      Tobacco: none      Alcohol: none      Drugs: none          Family History  Problem Relation Age of Onset  . Hypertension Mother   . Heart disease Mother 7       mild AMI/no stenting  . Cancer Father        unknown cancer? stomach? tobacco  . Heart disease Father 57       AMI as cause of death  . Hypertension Sister   . Hyperlipidemia Sister      ROS Review of Systems See HPI Constitution: No fevers or chills No malaise No diaphoresis Skin: No rash or itching Eyes: no blurry vision, no double vision GU: no dysuria or hematuria Neuro:  no dizziness or headaches all others reviewed and negative   Objective: Vitals:   12/05/17 1152  BP: 138/84  Pulse: 88  Resp: 16  Temp: 98.6 F (37 C)  TempSrc: Oral  SpO2: 95%  Weight: 187 lb 9.6 oz (85.1 kg)  Height: 5' 4.17" (1.63 m)    Physical Exam General: alert, oriented, in NAD Head: normocephalic, atraumatic, no sinus tenderness Eyes: EOM intact, no scleral icterus or conjunctival injection Ears: TM clear bilaterally Nose: mucosa nonerythematous, nonedematous Throat: no pharyngeal exudate or erythema Lymph: no posterior auricular, submental or cervical lymph adenopathy Heart: normal rate, normal sinus rhythm, no murmurs Lungs: clear to auscultation bilaterally, no wheezing   Assessment and Plan Dereon was seen today for recheck bronchitis.  Diagnoses and all orders for this  visit:  Bronchitis, acute, with bronchospasm- improved with prednisone and zpak     Iretha Kirley A Icie Kuznicki

## 2017-12-05 NOTE — Patient Instructions (Signed)
     IF you received an x-ray today, you will receive an invoice from [facility]. Please contact [facility] at [phone] with questions or concerns regarding your invoice.   IF you received labwork today, you will receive an invoice from [facility]. Please contact [facility] at [phone] with questions or concerns regarding your invoice.   Our billing staff will not be able to assist you with questions regarding bills from these companies.  You will be contacted with the lab results as soon as they are available. The fastest way to get your results is to activate your My Chart account. Instructions are located on the last page of this paperwork. If you have not heard from us regarding the results in 2 weeks, please contact this office.     

## 2017-12-10 ENCOUNTER — Encounter: Payer: Self-pay | Admitting: Family Medicine

## 2017-12-10 ENCOUNTER — Ambulatory Visit: Payer: BLUE CROSS/BLUE SHIELD | Admitting: Family Medicine

## 2017-12-10 ENCOUNTER — Other Ambulatory Visit: Payer: Self-pay

## 2017-12-10 ENCOUNTER — Other Ambulatory Visit: Payer: Self-pay | Admitting: Family Medicine

## 2017-12-10 VITALS — BP 140/88 | HR 90 | Temp 98.3°F | Resp 18 | Ht 63.27 in | Wt 187.2 lb

## 2017-12-10 DIAGNOSIS — M25512 Pain in left shoulder: Secondary | ICD-10-CM

## 2017-12-10 DIAGNOSIS — K7581 Nonalcoholic steatohepatitis (NASH): Secondary | ICD-10-CM

## 2017-12-10 DIAGNOSIS — K8020 Calculus of gallbladder without cholecystitis without obstruction: Secondary | ICD-10-CM | POA: Diagnosis not present

## 2017-12-10 DIAGNOSIS — M25441 Effusion, right hand: Secondary | ICD-10-CM | POA: Diagnosis not present

## 2017-12-10 DIAGNOSIS — M25561 Pain in right knee: Secondary | ICD-10-CM | POA: Diagnosis not present

## 2017-12-10 DIAGNOSIS — N281 Cyst of kidney, acquired: Secondary | ICD-10-CM

## 2017-12-10 DIAGNOSIS — M25562 Pain in left knee: Secondary | ICD-10-CM | POA: Diagnosis not present

## 2017-12-10 DIAGNOSIS — M25511 Pain in right shoulder: Secondary | ICD-10-CM

## 2017-12-10 DIAGNOSIS — R590 Localized enlarged lymph nodes: Secondary | ICD-10-CM | POA: Diagnosis not present

## 2017-12-10 DIAGNOSIS — F5104 Psychophysiologic insomnia: Secondary | ICD-10-CM

## 2017-12-10 DIAGNOSIS — R351 Nocturia: Secondary | ICD-10-CM

## 2017-12-10 DIAGNOSIS — M25442 Effusion, left hand: Secondary | ICD-10-CM

## 2017-12-10 LAB — POCT URINALYSIS DIP (MANUAL ENTRY)
BILIRUBIN UA: NEGATIVE mg/dL
Bilirubin, UA: NEGATIVE
Glucose, UA: NEGATIVE mg/dL
Leukocytes, UA: NEGATIVE
Nitrite, UA: NEGATIVE
PH UA: 7 (ref 5.0–8.0)
SPEC GRAV UA: 1.025 (ref 1.010–1.025)
UROBILINOGEN UA: 1 U/dL

## 2017-12-10 MED ORDER — TRAZODONE HCL 50 MG PO TABS: 25.0000 mg | ORAL_TABLET | Freq: Every evening | ORAL | 3 refills | Status: DC | PRN

## 2017-12-10 NOTE — Patient Instructions (Addendum)
-recommend weight loss, exercise for 30-60 minutes five days per week; recommend 1200 kcal restriction per day with a minimum of 60 grams of protein per day. -Eat 3 meals per day minimum. -https://www.matthews.info/ for food diary. -Exercise 3 days per week.  Nonalcoholic Fatty Liver Disease Diet Nonalcoholic fatty liver disease is a condition that causes fat to accumulate in and around the liver. The disease makes it harder for the liver to work the way that it should. Following a healthy diet can help to keep nonalcoholic fatty liver disease under control. It can also help to prevent or improve conditions that are associated with the disease, such as heart disease, diabetes, high blood pressure, and abnormal cholesterol levels. Along with regular exercise, this diet:  Promotes weight loss.  Helps to control blood sugar levels.  Helps to improve the way that the body uses insulin.  What do I need to know about this diet?  Use the glycemic index (GI) to plan your meals. The index tells you how quickly a food will raise your blood sugar. Choose low-GI foods. These foods take a longer time to raise blood sugar.  Keep track of how many calories you take in. Eating the right amount of calories will help you to achieve a healthy weight.  You may want to follow a Mediterranean diet. This diet includes a lot of vegetables, lean meats or fish, whole grains, fruits, and healthy oils and fats. What foods can I eat? Grains Whole grains, such as whole-wheat or whole-grain breads, crackers, tortillas, cereals, and pasta. Stone-ground whole wheat. Pumpernickel bread. Unsweetened oatmeal. Bulgur. Barley. Quinoa. Brown or wild rice. Corn or whole-wheat flour tortillas. Vegetables Lettuce. Spinach. Peas. Beets. Cauliflower. Cabbage. Broccoli. Carrots. Tomatoes. Squash. Eggplant. Herbs. Peppers. Onions. Cucumbers. Brussels sprouts. Yams and sweet potatoes. Beans. Lentils. Fruits Bananas. Apples. Oranges. Grapes.  Papaya. Mango. Pomegranate. Kiwi. Grapefruit. Cherries. Meats and Other Protein Sources Seafood and shellfish. Lean meats. Poultry. Tofu. Dairy Low-fat or fat-free dairy products, such as yogurt, cottage cheese, and cheese. Beverages Water. Sugar-free drinks. Tea. Coffee. Low-fat or skim milk. Milk alternatives, such as soy or almond milk. Real fruit juice. Condiments Mustard. Relish. Low-fat, low-sugar ketchup and barbecue sauce. Low-fat or fat-free mayonnaise. Sweets and Desserts Sugar-free sweets. Fats and Oils Avocado. Canola or olive oil. Nuts and nut butters. Seeds. The items listed above may not be a complete list of recommended foods or beverages. Contact your dietitian for more options. What foods are not recommended? Palm oil and coconut oil. Processed foods. Fried foods. Sweetened drinks, such as sweet tea, milkshakes, snow cones, iced sweet drinks, and sodas. Alcohol. Sweets. Foods that contain a lot of salt or sodium. The items listed above may not be a complete list of foods and beverages to avoid. Contact your dietitian for more information. This information is not intended to replace advice given to you by your health care provider. Make sure you discuss any questions you have with your health care provider. Document Released: 04/01/2015 Document Revised: 04/22/2016 Document Reviewed: 12/10/2014 Elsevier Interactive Patient Education  2018 [employer].   IF you received an x-ray today, you will receive an invoice from [facility]. Please contact [facility] at [phone] with questions or concerns regarding your invoice.   IF you received labwork today, you will receive an invoice from [facility]. Please contact [facility] at [phone] with questions or concerns regarding your invoice.   Our billing staff will not be able to assist you with questions regarding bills from these companies.  You will be contacted with the lab results as soon as they are  available. The fastest way to get your results is to activate your My Chart account. Instructions are located on the last page of this paperwork. If you have not heard from [place] regarding the results in 2 weeks, please contact this office.

## 2017-12-10 NOTE — Progress Notes (Signed)
Subjective:    Patient ID: Elizabeth Yoder, female    DOB: 1956/03/28, 62 y.o.   MRN: [id number]  12/10/2017  Consult (go over labs)    HPI This 62 y.o. female presents for two month follow-up of the following persistent posterior cervical lymphadenopathy, sinusitis, R middle finger swelling, elevated LFTs, fatty liver.  Management changes made at previous visit with in November 2019 include the following:  1.  Persistent posterior cervical lymphadenopathy.  Patient status post CT of the neck that showed sub-centimeter sized lymph nodes bilaterally.  Observation was recommended by radiologist.  Patient very concerned with the persistence of these enlarged lymph nodes over the past 3 months.  Referral to ear nose and throat provider will be placed today. Update today 12/10/17:  NEVER HEARD ANYTHING REGARDING ENT REFERRAL.  NO PERSISTENT SINUS PRESSURE OR RHINORRHEA.   TOOK PREDNISONE, ZITHROMAX, DOXYCYCLINE. NO CLARITIN OR FLONASE.  2.  Patient also suffering with persistent sinusitis symptoms with cough.  Prescription for doxycycline, prednisone, and Tessalon Perles provided to treat sinusitis may be residual.  Obtain chest x-ray for further evaluation as well.  uPDATE TODAY, 12/10/17: Sinusitis symptoms improved.  Cough much improved yet somewhat persistent.  3.  Patient also suffering with right middle finger swelling.  Recent overuse with gardening tools.  Recommend rest, avoiding repetitive motion with blower.  No other joint involvement at this time. Update today, 12/10/17:R MIDDLE FINGER IS STILL SWOLLEN; DECREASED ROM OF R PIP.  HAD BEEN TAKING ALEVE; [place] OF DIFFUSE SWELLING PIP 2ND, 3RD, LEFT PIP JOINTS.  ALSO FEELING IN SHOULDERS AFTER STOPPING PREDNISONE LAST WEEK FOR BRONCHITIS.  WITH WALKING, KNEES SEEM SWOLLEN; HARD TO EXTEND KNEES.SEDIMENTATION RATE IS SWOLLEN.  PATIENT IS WORRIED ABOUT RA.  NO RHEUMATOID ARTHRITIS IN FAMILY.  4.  Chronic elevated liver function test.   Previous abdominal ultrasound that revealed nonalcoholic fatty liver.  Will repeat elevated liver function test today.  May warrant repeat abdominal ultrasound or CT of the abdomen to further evaluate.  Consider referral to gastroenterology if elevated liver function test persist.  Recommend avoiding alcohol and Tylenol-containing products. NO ALCOHOL.  TAKES TYLENOL FOR PAIN.   5.  History of renal cyst present on abdominal ultrasound.  Consider repeating imaging to follow-up on this renal cyst however normally benign etiology.  Lab results from November 2018 visit:  One liver function test remains elevated. Your ALT is 36. There is no evidence of Hepatitis A/B/C.  Your Sedimentation rate remains slightly elevated at 47. This represents inflammation.  I recommend proceeding with a CT of the abdomen to better evaluate your liver; you can undergo the CT of the abdomen when you undergo the CT of chest.  Abdominal ultrasound IMPRESSION on 11/03/17: Gallstones without sonographic evidence of acute cholecystitis. The gallstones have been described in the past. Increased hepatic echotexture compatible with fatty infiltrative change unchanged from the previous study.  CT CHEST IMPRESSION from 10/25/17: Negative for pulmonary nodule. No evidence neoplastic process. Finding on plain films likely due to a pulmonary vein. Mild scar in the periphery of the right middle lobe and adjacent right lower lobe may be due to prior infection. Small hiatal hernia. Fatty infiltration of the liver. Gallstones.  R MIDDLE FINGER XRAY FINDINGS 10/25/17: Middle finger soft tissue swelling without underlying fracture, erosion, or focal bone lesion. The middle finger osseous structures appear denser than the others, but this may be related to technical factors and soft tissue swelling; this appearance does not persist in the lateral  projection. IMPRESSION: Soft tissue swelling without acute or erosive  osseous finding.    CXR IMPRESSION 10/10/17: 1. There is a 1.8 cm nodular density projecting over the lower lobes on the lateral view, not clearly seen on the frontal view. While this could represent a vessel, further evaluation with non emergent contrast-enhanced chest CT is recommended. These results will be called to the ordering clinician or representative by the Radiologist Assistant, and communication documented in the PACS or zVision Dashboard.   CT SOFT TISSUE NECK IMPRESSION 09/21/17: No evidence of submandibular region lymphadenopathy. Skin marker in the right posterior neck corresponds to a posterior triangle node measuring 9 mm in diameter. There is a mirror image node on the left that measures 6 mm. The nodal pattern is not particularly worrisome, and this may simply be due to some minor inflammation, possibly due to folliculitis or some other minor inflammatory process. Given the absence of a worrisome pattern, this could be observed clinically.   6.  INSOMNIA: NOT SLEEPING; REQUESTING AMBIEN.  DRINKING COFFEE AT 2PM.  BP Readings from Last 3 Encounters:  12/19/17 (!) 151/75  12/10/17 140/88  12/05/17 138/84   Wt Readings from Last 3 Encounters:  12/10/17 187 lb 3.2 oz (84.9 kg)  12/05/17 187 lb 9.6 oz (85.1 kg)  12/02/17 184 lb 3.2 oz (83.6 kg)   Immunization History  Administered Date(s) Administered  . Td 06/20/2013    Review of Systems  Constitutional: Negative for chills, diaphoresis, fatigue and fever.  HENT: Negative for congestion, dental problem, drooling, ear pain, facial swelling, postnasal drip, rhinorrhea, sinus pressure, sinus pain, sneezing, sore throat, trouble swallowing and voice change.   Eyes: Negative for visual disturbance.  Respiratory: Negative for cough and shortness of breath.   Cardiovascular: Negative for chest pain, palpitations and leg swelling.  Gastrointestinal: Negative for abdominal pain, constipation, diarrhea,  nausea and vomiting.  Endocrine: Negative for cold intolerance, heat intolerance, polydipsia, polyphagia and polyuria.  Musculoskeletal: Positive for arthralgias and joint swelling.  Neurological: Negative for dizziness, tremors, seizures, syncope, facial asymmetry, speech difficulty, weakness, light-headedness, numbness and headaches.  Psychiatric/Behavioral: Positive for sleep disturbance. Negative for dysphoric mood. The patient is not nervous/anxious.     Past Medical History:  Diagnosis Date  . Arthritis    Past Surgical History:  Procedure Laterality Date  . TUBAL LIGATION     No Known Allergies No current outpatient medications on file prior to visit.   No current facility-administered medications on file prior to visit.    Social History   Socioeconomic History  . Marital status: Divorced    Spouse name: Not on file  . Number of children: 1  . Years of education: Not on file  . Highest education level: Not on file  Social Needs  . Financial resource strain: Not on file  . Food insecurity - worry: Not on file  . Food insecurity - inability: Not on file  . Transportation needs - medical: Not on file  . Transportation needs - non-medical: Not on file  Occupational History  . Occupation: [employer] work  Tobacco Use  . Smoking status: Never Smoker  . Smokeless tobacco: Never Used  Substance and Sexual Activity  . Alcohol use: No  . Drug use: No  . Sexual activity: Yes    Birth control/protection: Post-menopausal  Other Topics Concern  . Not on file  Social History Narrative   Marital status: divorced in 2009; not dating in 2018      Children: 1 son;  2 grandchildren; 1 gg.       Lives: with son       Employment;  [employer] work in 2018      Tobacco: none      Alcohol: none      Drugs: none         Family History  Problem Relation Age of Onset  . Hypertension Mother   . Heart disease Mother 1       mild AMI/no stenting  . Cancer Father        unknown  cancer? stomach? tobacco  . Heart disease Father 29       AMI as cause of death  . Hypertension Sister   . Hyperlipidemia Sister        Objective:    BP 140/88 (BP Location: Left Arm, Patient Position: Sitting)   Pulse 90   Temp 98.3 F (36.8 C) (Oral)   Resp 18   Ht 5' 3.27" (1.607 m)   Wt 187 lb 3.2 oz (84.9 kg)   SpO2 96%   BMI 32.88 kg/m  Physical Exam  Constitutional: She is oriented to person, place, and time. She appears well-developed and well-nourished. No distress.  HENT:  Head: Normocephalic and atraumatic.  Right Ear: External ear normal.  Left Ear: External ear normal.  Nose: Nose normal.  Mouth/Throat: Oropharynx is clear and moist.  Eyes: Conjunctivae and EOM are normal. Pupils are equal, round, and reactive to light.  Neck: Normal range of motion. Neck supple. Carotid bruit is not present. No thyromegaly present.  Cardiovascular: Normal rate, regular rhythm, normal heart sounds and intact distal pulses. Exam reveals no gallop and no friction rub.  No murmur heard. Pulmonary/Chest: Effort normal and breath sounds normal. She has no wheezes. She has no rales.  Abdominal: Soft. Bowel sounds are normal. She exhibits no distension and no mass. There is no tenderness. There is no rebound and no guarding.  Musculoskeletal:       Right shoulder: She exhibits decreased range of motion and pain. She exhibits no swelling.       Left shoulder: She exhibits decreased range of motion. She exhibits no swelling.       Right knee: She exhibits decreased range of motion, swelling and effusion. No tenderness found. No patellar tendon tenderness noted.       Left knee: She exhibits decreased range of motion, swelling and effusion. No tenderness found.       Cervical back: Normal.       Thoracic back: Normal.       Lumbar back: Normal.       Right hand: She exhibits decreased range of motion and swelling.       Left hand: She exhibits decreased range of motion and swelling.    DIFFUSE SWELLING PIP JOINTS B HANDS; DECREASED ROM DUE TO STIFFNESS AND SWELLING OF PIP JOINTS; ACUTE SYNOVITIS APPRECIATED.  Lymphadenopathy:    She has no cervical adenopathy.  Neurological: She is alert and oriented to person, place, and time. No cranial nerve deficit.  Skin: Skin is warm and dry. No rash noted. She is not diaphoretic. No erythema. No pallor.  Psychiatric: She has a normal mood and affect. Her behavior is normal.   No results found. Depression screen [facility] 2/9 12/10/2017 12/05/2017 12/02/2017 10/10/2017 09/12/2017  Decreased Interest 0 0 0 0 0  Down, Depressed, Hopeless 0 0 0 0 0  PHQ - 2 Score 0 0 0 0 0   Fall Risk  12/10/2017 12/05/2017 12/02/2017 10/10/2017 09/12/2017  Falls in the past year? No No No No No        Assessment & Plan:   1. Swelling of joint of left hand   2. Swelling of joint of right hand   3. Pain of both shoulder joints   4. Acute pain of both knees   5. Psychophysiological insomnia   6. Nocturia   7. Cervical lymphadenopathy   8. NASH (nonalcoholic steatohepatitis)   9. Calculus of gallbladder without cholecystitis without obstruction   10. Renal cyst     New onset diffuse swelling and pain of PIP joints of bilateral hands.  Also bilateral shoulder pain and bilateral knee pain.  Very concerning for autoimmune or rheumatological process.  Symptoms significantly improved with recent prednisone taper for sinusitis.  New onset symptoms since visit 2 months ago.  Obtain autoimmune labs including rheumatoid factor and ANA.  We will also obtain CK and sedimentation rate due to shoulder involvement.  Due to concern of autoimmune etiology, refer to rheumatology.  Ongoing insomnia.  I declined to provide a prescription for Ambien  due to potential side effects.  Prescription for trazodone provided.  Avoid caffeine intake after 12 noon.  New onset nocturia.  Send urine culture to rule out secondary infection.  Sinusitis has improved.  Cervical  lymphadenopathy noted on CT.  Status post antibiotic and prednisone therapy.  Referral to ear nose and throat specialist placed at last visit; however, patient never heard about appointment time.  I will follow-up on ENT referral.  If lymph nodes enlarged.   Abnormality on chest x-ray followed up with CT of the chest.  CT chest negative.  Elevated LFTs are persistent.  Abdominal ultrasound confirms fatty liver.  Recommend weight loss, low-fat diet.  Recommend avoiding alcohol as well as Tylenol-containing products.  Patient warrants hepatitis vaccination in the future.  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  Return to clinic for development of right upper quadrant pain, severe nausea with vomiting.  -prolonged face-to-face for 40 minutes with greater than 50% of time dedicated to counseling and coordination of care.  Orders Placed This Encounter  Procedures  . Urine Culture    Order Specific Question:   Source    Answer:   clean catch  . CBC with Differential/Platelet  . Comprehensive metabolic panel  . Sedimentation rate  . Rheumatoid factor  . CK  . ANA,IFA RA Diag Pnl w/rflx Tit/Patn  . Ambulatory referral to Rheumatology    Referral Priority:   Routine    Referral Type:   Consultation    Referral Reason:   Specialty Services Required    Requested Specialty:   Rheumatology    Number of Visits Requested:   1  . Ambulatory referral to ENT    Referral Priority:   Routine    Referral Type:   Consultation    Referral Reason:   Specialty Services Required    Requested Specialty:   Otolaryngology    Number of Visits Requested:   1  . POCT urinalysis dipstick   Meds ordered this encounter  Medications  . traZODone (DESYREL) 50 MG tablet    Sig: Take 0.5-1 tablets (25-50 mg total) by mouth at bedtime as needed for sleep.    Dispense:  30 tablet    Refill:  3    Return in about 3 months (around 03/10/2018) for follow-up chronic medical conditions.   Tanis Burnley Elayne Guerin, M.D. Primary [facility] at [facility]  previously [facility] [address] [place], [place]  [address] [phone] phone [phone] fax

## 2017-12-12 ENCOUNTER — Encounter: Payer: Self-pay | Admitting: Family Medicine

## 2017-12-12 ENCOUNTER — Ambulatory Visit: Payer: BLUE CROSS/BLUE SHIELD | Admitting: Podiatry

## 2017-12-12 ENCOUNTER — Encounter: Payer: Self-pay | Admitting: Podiatry

## 2017-12-12 ENCOUNTER — Telehealth: Payer: Self-pay

## 2017-12-12 DIAGNOSIS — M05771 Rheumatoid arthritis with rheumatoid factor of right ankle and foot without organ or systems involvement: Secondary | ICD-10-CM | POA: Diagnosis not present

## 2017-12-12 DIAGNOSIS — M779 Enthesopathy, unspecified: Secondary | ICD-10-CM

## 2017-12-12 DIAGNOSIS — M25571 Pain in right ankle and joints of right foot: Secondary | ICD-10-CM | POA: Diagnosis not present

## 2017-12-12 DIAGNOSIS — M05772 Rheumatoid arthritis with rheumatoid factor of left ankle and foot without organ or systems involvement: Secondary | ICD-10-CM

## 2017-12-12 LAB — URINE CULTURE

## 2017-12-12 LAB — CK: CK TOTAL: 23 U/L — AB (ref 24–173)

## 2017-12-12 LAB — COMPREHENSIVE METABOLIC PANEL
A/G RATIO: 1.3 (ref 1.2–2.2)
ALT: 61 IU/L — AB (ref 0–32)
AST: 53 IU/L — AB (ref 0–40)
Albumin: 3.9 g/dL (ref 3.6–4.8)
Alkaline Phosphatase: 104 IU/L (ref 39–117)
BILIRUBIN TOTAL: 0.5 mg/dL (ref 0.0–1.2)
BUN/Creatinine Ratio: 16 (ref 12–28)
BUN: 10 mg/dL (ref 8–27)
CHLORIDE: 100 mmol/L (ref 96–106)
CO2: 21 mmol/L (ref 20–29)
Calcium: 9.4 mg/dL (ref 8.7–10.3)
Creatinine, Ser: 0.64 mg/dL (ref 0.57–1.00)
GFR, EST AFRICAN AMERICAN: 111 mL/min/{1.73_m2} (ref >59)
GFR, EST NON AFRICAN AMERICAN: 97 mL/min/{1.73_m2} (ref >59)
GLOBULIN, TOTAL: 3.1 g/dL (ref 1.5–4.5)
Glucose: 93 mg/dL (ref 65–99)
POTASSIUM: 5 mmol/L (ref 3.5–5.2)
SODIUM: 138 mmol/L (ref 134–144)
TOTAL PROTEIN: 7 g/dL (ref 6.0–8.5)

## 2017-12-12 LAB — CBC WITH DIFFERENTIAL/PLATELET
BASOS: 0 %
Basophils Absolute: 0 10*3/uL (ref 0.0–0.2)
EOS (ABSOLUTE): 0.4 10*3/uL (ref 0.0–0.4)
EOS: 4 %
HEMATOCRIT: 44.6 % (ref 34.0–46.6)
Hemoglobin: 14.5 g/dL (ref 11.1–15.9)
Immature Grans (Abs): 0 10*3/uL (ref 0.0–0.1)
Immature Granulocytes: 0 %
Lymphocytes Absolute: 1.4 10*3/uL (ref 0.7–3.1)
Lymphs: 14 %
MCH: 29.5 pg (ref 26.6–33.0)
MCHC: 32.5 g/dL (ref 31.5–35.7)
MCV: 91 fL (ref 79–97)
MONOS ABS: 1.1 10*3/uL — AB (ref 0.1–0.9)
Monocytes: 11 %
NEUTROS ABS: 7.3 10*3/uL — AB (ref 1.4–7.0)
Neutrophils: 71 %
PLATELETS: 378 10*3/uL (ref 150–379)
RBC: 4.91 x10E6/uL (ref 3.77–5.28)
RDW: 13.7 % (ref 12.3–15.4)
WBC: 10.2 10*3/uL (ref 3.4–10.8)

## 2017-12-12 LAB — ANA,IFA RA DIAG PNL W/RFLX TIT/PATN
ANA TITER 1: NEGATIVE
Cyclic Citrullin Peptide Ab: >250 units — ABNORMAL HIGH (ref 0–19)

## 2017-12-12 LAB — SEDIMENTATION RATE: Sed Rate: 12 mm/hr (ref 0–40)

## 2017-12-12 MED ORDER — DICLOFENAC SODIUM 75 MG PO TBEC: 75.0000 mg | DELAYED_RELEASE_TABLET | Freq: Two times a day (BID) | ORAL | 2 refills | Status: DC

## 2017-12-12 NOTE — Progress Notes (Signed)
Subjective:   Patient ID: Elizabeth Yoder, female   DOB: 62 y.o.   MRN: [id number]   HPI Patient states the right foot has been feeling better but she is been getting a lot of pain in her joints and she had blood work done that she would like for me to interpret   ROS      Objective:  Physical Exam  Neurovascular status intact with patient's right foot showing there to be minimal discomfort in the lesser MPJ joint and minimal discomfort at the posterior tibial insertion.  She does have some hand pain and overall stiffness and did have pneumonia     Assessment:  Blood work indicating she may be dealing with rheumatoid arthritis and she is to be scheduled with the rheumatologist in the next month with improved right foot     Plan:  H&P condition reviewed and at this point I went ahead and I recommended the continuation of conservative care.  Patient will continue to work with this in this fashion will see her rheumatologist and we will review results when the rheumatologist sees patient.  Most likely dealing with rheumatoid arthritis

## 2017-12-12 NOTE — Telephone Encounter (Signed)
Pt wanted me to inform you that Saturday she became very stiff with pain and is having trouble sleeping

## 2017-12-12 NOTE — Progress Notes (Signed)
Spoke with pt regarding results.

## 2017-12-16 ENCOUNTER — Telehealth: Payer: Self-pay | Admitting: Family Medicine

## 2017-12-16 NOTE — Telephone Encounter (Signed)
Copied from [place] [phone]. Topic: Referral - Question >> Dec 16, 2017  4:29 PM Robina Ade, Helene Kelp D wrote: Reason for CRM: Patient called and would like to talk to [place] about her referral to see Rheumatologist. She would like the status of her referral and would like to know how long it will take. Patient said that her joint are swollen and not been able to go to bed and need help from others to get around. Patient would like to see if Dr. Tamala Julian can give her something for pain or to help her get around. Please call patient back.

## 2017-12-19 ENCOUNTER — Encounter (HOSPITAL_COMMUNITY): Payer: Self-pay | Admitting: Emergency Medicine

## 2017-12-19 ENCOUNTER — Ambulatory Visit: Payer: Self-pay | Admitting: *Deleted

## 2017-12-19 ENCOUNTER — Emergency Department (HOSPITAL_COMMUNITY)
Admission: EM | Admit: 2017-12-19 | Discharge: 2017-12-19 | Disposition: A | Discharge status: Home / Self Care | Payer: BLUE CROSS/BLUE SHIELD | Attending: Emergency Medicine | Admitting: Emergency Medicine

## 2017-12-19 DIAGNOSIS — M129 Arthropathy, unspecified: Secondary | ICD-10-CM | POA: Insufficient documentation

## 2017-12-19 DIAGNOSIS — Z79899 Other long term (current) drug therapy: Secondary | ICD-10-CM | POA: Diagnosis not present

## 2017-12-19 DIAGNOSIS — W06XXXA Fall from bed, initial encounter: Secondary | ICD-10-CM | POA: Insufficient documentation

## 2017-12-19 DIAGNOSIS — M2550 Pain in unspecified joint: Secondary | ICD-10-CM | POA: Diagnosis present

## 2017-12-19 HISTORY — DX: Unspecified osteoarthritis, unspecified site: M19.90

## 2017-12-19 MED ORDER — PREDNISONE 20 MG PO TABS
60.0000 mg | ORAL_TABLET | Freq: Once | ORAL | Status: AC
  Administered 2017-12-19: 60 mg via ORAL
  Filled 2017-12-19: qty 3

## 2017-12-19 MED ORDER — NAPROXEN 500 MG PO TABS
500.0000 mg | ORAL_TABLET | Freq: Once | ORAL | Status: AC
  Administered 2017-12-19: 500 mg via ORAL
  Filled 2017-12-19: qty 1

## 2017-12-19 MED ORDER — NAPROXEN 500 MG PO TABS: 500.0000 mg | ORAL_TABLET | Freq: Two times a day (BID) | ORAL | 0 refills | Status: DC

## 2017-12-19 MED ORDER — PREDNISONE 10 MG (21) PO TBPK: ORAL_TABLET | Freq: Every day | ORAL | 0 refills | Status: DC

## 2017-12-19 NOTE — ED Triage Notes (Signed)
Per [facility] pt dealing with arthitis flare up for past 12 days and has not been given anything for pain management. Pt is waiting for appt with RA and is hoping can further that along by coming to ER. Pt having increased swelling in knees and ankles. Pain 9/10.

## 2017-12-19 NOTE — ED Provider Notes (Signed)
[facility] DEPT Provider Note   CSN: [id number] Arrival date & time: 12/19/17  1952     History   Chief Complaint No chief complaint on file.   HPI Elizabeth Yoder is a 62 y.o. female with a hx of RA who presents with complaint of pain and swelling to multiple joints for the past 1.5 months, fall today prompted ER visit.. Patient was diagnosed with rheumatoid arthritis 12/10/17. States there is a referral that has been placed to rheumatology, but she has not been able to get this appointment yet. She is having symptoms to bilateral shoulders, wrists, fingers, knees, ankles, and toes. Patient rates her pain a 4/10 at rest and a 0/92 with certain movements. Patient states that since 01/13 the pain in her lower extremity joints has gotten to the point that it is difficult to walk-she has not been able to ambulate since 0 1/13, refusing to ambulate in the emergency department today.. Has tried Aleve, Ibuprofen, and Tylenol arthritis all with minimal improvement, but not complete relief. No other alleviating or aggravating factors.  Today she rolled over in bed causing her to slide off of the edge of the bed onto her buttocks.  No head injury or loss of consciousness.  Pain has not changed in location or in severity secondary to fall.    Denies pain, back pain fever, nausea, vomiting, numbness, or weakness.   HPI  Past Medical History:  Diagnosis Date  . Arthritis     Patient Active Problem List   Diagnosis Date Noted  . Gallstone 06/29/2013  . Renal cyst 06/29/2013  . Renal stone 06/29/2013    Past Surgical History:  Procedure Laterality Date  . TUBAL LIGATION      OB History    No data available       Home Medications    Prior to Admission medications   Medication Sig Start Date End Date Taking? Authorizing Provider  albuterol (PROVENTIL HFA;VENTOLIN HFA) 108 (90 Base) MCG/ACT inhaler Inhale 2 puffs into the lungs every 6 (six) hours as needed  for wheezing or shortness of breath. Patient not taking: Reported on 12/10/2017 12/02/17   Forrest Moron, MD  azithromycin (ZITHROMAX) 250 MG tablet Take 2 tabs on day 1 then one tablet each day after Patient not taking: Reported on 12/10/2017 12/02/17   Forrest Moron, MD  benzonatate (TESSALON) 100 MG capsule Take 1-2 capsules (100-200 mg total) by mouth 3 (three) times daily as needed for cough. Patient not taking: Reported on 12/02/2017 11/15/17   Wardell Honour, MD  diclofenac (VOLTAREN) 75 MG EC tablet Take 1 tablet (75 mg total) by mouth 2 (two) times daily. 12/12/17   Wallene Huh, DPM  doxycycline (VIBRAMYCIN) 100 MG capsule Take 1 capsule (100 mg total) 2 (two) times daily by mouth. Patient not taking: Reported on 12/02/2017 10/10/17   Wardell Honour, MD  loratadine (CLARITIN) 10 MG tablet Take 1 tablet (10 mg total) by mouth daily. Patient not taking: Reported on 12/02/2017 09/12/17   Wardell Honour, MD  traZODone (DESYREL) 50 MG tablet Take 0.5-1 tablets (25-50 mg total) by mouth at bedtime as needed for sleep. 12/10/17   Wardell Honour, MD  triamcinolone cream (KENALOG) 0.1 % Apply 1 application topically 2 (two) times daily. Patient not taking: Reported on 12/02/2017 09/12/17   Wardell Honour, MD    Family History Family History  Problem Relation Age of Onset  . Hypertension Mother   .  Heart disease Mother 68       mild AMI/no stenting  . Cancer Father        unknown cancer? stomach? tobacco  . Heart disease Father 18       AMI as cause of death  . Hypertension Sister   . Hyperlipidemia Sister     Social History Social History   Tobacco Use  . Smoking status: Never Smoker  . Smokeless tobacco: Never Used  Substance Use Topics  . Alcohol use: No  . Drug use: No     Allergies   Patient has no known allergies.   Review of Systems Review of Systems  Constitutional: Negative for chills and fever.  Musculoskeletal: Positive for arthralgias and joint swelling.  Negative for back pain.  Neurological: Negative for weakness and numbness.    Physical Exam Updated Vital Signs BP (!) 142/93 (BP Location: Left Arm)   Pulse 93   Temp 98.9 F (37.2 C) (Oral)   Resp 18   SpO2 97%   Physical Exam  Constitutional: She appears well-developed and well-nourished.  Non-toxic appearance. No distress.  HENT:  Head: Normocephalic and atraumatic.  Eyes: Conjunctivae are normal. Right eye exhibits no discharge. Left eye exhibits no discharge.  Neck: Normal range of motion. Neck supple. No spinous process tenderness present.  Cardiovascular: Normal rate and regular rhythm.  No murmur heard. Pulmonary/Chest: Effort normal and breath sounds normal. No apnea.  Abdominal: Soft. She exhibits no distension. There is no tenderness.  Musculoskeletal:  Extremities: Mild diffuse swelling noted to digits and wrists bilaterally.  Otherwise no appreciable swelling.  No obvious deformity, erythema, ecchymosis, or overlying warmth.  Patient has mildly limited range of motion to bilateral shoulders, wrists, upper extremity digits, knees, and ankles.  Range of motion is limited secondary to pain.  All other joints have full range of motion-able to lift bilateral legs off of the bed.  Patient is diffusely tender to all joints that she has limited range of motion in.  There is no focal tenderness or point bony tenderness in any of the joints. Back: There is no midline tenderness or paraspinal muscle tenderness.  Neurological: She is alert.  Clear speech.  Patient has 5 out of 5 grip strength bilaterally.  She has 5 out of 5 strength with plantar and dorsiflexion bilaterally.  She is able to lift bilateral legs off of the bed.  Sensation is grossly intact bilateral upper and lower extremities.  Patient refusing to ambulate in order to assess gait, patient states that she has not walked since 01/13, this is not a new change for her.  Psychiatric: She has a normal mood and affect. Her  behavior is normal. Thought content normal.  Nursing note and vitals reviewed.    ED Treatments / Results  Labs (all labs ordered are listed, but only abnormal results are displayed) Labs Reviewed - No data to display  EKG  EKG Interpretation None      Radiology No results found.  Procedures Procedures (including critical care time)  Medications Ordered in ED Medications  predniSONE (DELTASONE) tablet 60 mg (60 mg Oral Given 12/19/17 2212)  naproxen (NAPROSYN) tablet 500 mg (500 mg Oral Given 12/19/17 2212)     Initial Impression / Assessment and Plan / ED Course  I have reviewed the triage vital signs and the nursing notes.  Pertinent labs & imaging results that were available during my care of the patient were reviewed by me and considered in my medical decision  making (see chart for details).  Patient presents with polyarticular pain and swelling that  has been ongoing and progressively worsening for 1.5 months, she presented to ED after fall today.  She is nontoxic-appearing, in no apparent distress, vitals are within normal limits other than blood pressure which is somewhat elevated, no indication of hypertensive emergency. Patient without signs of serious head, neck, or back injury. [place] CT head injury/trauma rule and C-spine rule suggest no imaging required. Patient has no focal neurologic deficits or midline spinal tenderness to palpation, doubt fracture or dislocation of the spine, doubt head bleed.  She is refusing to ambulate in the emergency department, she has not ambulated since 01/13.  There appears to be no acute change secondary to her fall.  Polyarticular symptoms have been ongoing for 1.5 months and have been worked up by primary care, patient is pending referral to rheumatology.  There appears to be no acute need for imaging.  Discharge patient home with prescriptions for naproxen and prednisone need for rheumatology and primary care follow-up.  I provided two  alternative rheumatologist in the area in the patient's discharge instructions. I discussed results, treatment plan, need for PCP/rheumatology follow-up, and return precautions with the patient. Provided opportunity for questions, patient confirmed understanding and is in agreement with plan.      Final Clinical Impressions(s) / ED Diagnoses   Final diagnoses:  Arthralgia, unspecified joint    ED Discharge Orders        Ordered    naproxen (NAPROSYN) 500 MG tablet  2 times daily     12/19/17 2205    predniSONE (STERAPRED UNI-PAK 21 TAB) 10 MG (21) TBPK tablet  Daily     12/19/17 2205       Amaryllis Dyke, PA-C 12/19/17 2223    Dorie Rank, MD 12/20/17 [phone]

## 2017-12-19 NOTE — Telephone Encounter (Signed)
[address]  /  Slid   sev  Feet  Onto  Floor  Unable  To  Stand  Or  Walk    C/o pain  In  Buttock  Area   Unable  To  Walk   Or  Stand  Due  To  Weakness     And    Swelling    Of  Lower  Legs      No  Chest  Pain  No  Shortness  Of  Breath     Pt  Advised   To  Go to  [facility]  And   Grandson   Will    Take    Her to  [facility]    Pt  Was  Advised  To  Allow  This  Caller  To phone  911   But  She  Is  Alert and  Oriented   And  She  Wants  To  Wait  For her  Yolanda Bonine     She  Has  telephone  And  Will call  911   If  She  Needs  To  She  Stated    Reason for Disposition . [1] Can't walk or can barely walk AND [2] new onset  Answer Assessment - Initial Assessment Questions 1. ONSET: "When did the swelling start?" (e.g., minutes, hours, days)      Started   Some  Swelling  approx  1  Month   Started  Off  In  Finger   2. LOCATION: "What part of the leg is swollen?"  "Are both legs swollen or just one leg?"        Both  Legs   From  Kneecap  Down  Swollen     Pain   unnable   To  Walk    3. SEVERITY: "How bad is the swelling?" (e.g., localized; mild, moderate, severe)  - Localized - small area of swelling localized to one leg  - MILD pedal edema - swelling limited to foot and ankle, pitting edema < 1/4 inch (6 mm) deep, rest and elevation eliminate most or all swelling  - MODERATE edema - swelling of lower leg to knee, pitting edema > 1/4 inch (6 mm) deep, rest and elevation only partially reduce swelling  - SEVERE edema - swelling extends above knee, facial or hand swelling present        Some   Swelling   And   Pain   Elbows  And  Arms    Hands     Severe    4. REDNESS: "Does the swelling look red or infected?"         The  l  Ankle  Is   Red     5. PAIN: "Is the swelling painful to touch?" If so, ask: "How painful is it?"   (Scale 1-10; mild, moderate or severe)       Pain  Scale  Of  7    6. FEVER: "Do you have a fever?" If so, ask: "What is it, how was it measured, and when did it start?"       Has  Low   Grade  Fever  During  The  Night   Night  Sweats      7. CAUSE: "What do you think is causing the leg swelling?"       RA    8. MEDICAL HISTORY: "Do you have a history of heart failure, kidney  disease, liver failure, or cancer?"         NO  LIVER  PROBLEMS     9. RECURRENT SYMPTOM: "Have you had leg swelling before?" If so, ask: "When was the last time?" "What happened that time?"     All    Started    About   One  1  Month     10. OTHER SYMPTOMS: "Do you have any other symptoms?" (e.g., chest pain, difficulty breathing)      Fell  -  Slid  Off bed   And   Landed  On  Lower  Back    11. PREGNANCY: "Is there any chance you are pregnant?" "When was your last menstrual period?"      N/a  Protocols used: LEG SWELLING AND EDEMA-A-AH

## 2017-12-19 NOTE — Discharge Instructions (Signed)
You were seen in the emergency department today for for pain in multiple joints.  You are given 2 prescriptions today: -Naproxen-this is a nonsteroidal anti-inflammatory medication that will help with pain and swelling.  You may take this once every 12 hours as needed.  Be sure to take this with food as it can cause stomach upset and at worst stomach bleeding.  Do not take other NSAIDs with this (Motrin, Aleve, Advil).  You may supplement with Tylenol. - Prednisone-this is a steroid that will also help with pain and swelling.  Take this as directed.   Be sure to follow-up with a rheumatologist in the next 3 days for reevaluation and further management of your symptoms.  I have given you information for to local rheumatologist.  If you are unable to get an appointment follow-up with your primary care provider in the next 3 days.  Return to the emergency department for new or worsening symptoms.

## 2017-12-19 NOTE — Telephone Encounter (Signed)
Pt is calling about her referral but the notes are incomplete.  Please advise

## 2017-12-19 NOTE — Telephone Encounter (Signed)
Pt calling again for an update.  Did adv pt of note above, please f/u with pt.

## 2017-12-20 DIAGNOSIS — K7581 Nonalcoholic steatohepatitis (NASH): Secondary | ICD-10-CM | POA: Insufficient documentation

## 2017-12-20 DIAGNOSIS — R590 Localized enlarged lymph nodes: Secondary | ICD-10-CM | POA: Insufficient documentation

## 2017-12-20 NOTE — Telephone Encounter (Signed)
Note completed.  Please process referrals.

## 2017-12-21 NOTE — Telephone Encounter (Signed)
Seen in ED, provider FYI.

## 2017-12-23 NOTE — Telephone Encounter (Signed)
ED notes reviewed.

## 2017-12-29 ENCOUNTER — Encounter: Payer: Self-pay | Admitting: Family Medicine

## 2018-03-29 ENCOUNTER — Encounter: Payer: BLUE CROSS/BLUE SHIELD | Admitting: Family Medicine

## 2018-04-21 ENCOUNTER — Encounter: Payer: BLUE CROSS/BLUE SHIELD | Admitting: Family Medicine

## 2018-04-24 ENCOUNTER — Encounter: Payer: Self-pay | Admitting: Family Medicine

## 2018-06-21 ENCOUNTER — Encounter: Payer: BLUE CROSS/BLUE SHIELD | Admitting: Family Medicine

## 2018-07-15 ENCOUNTER — Encounter: Payer: Self-pay | Admitting: Family Medicine

## 2018-08-11 ENCOUNTER — Encounter: Payer: Self-pay | Admitting: Family Medicine

## 2018-09-29 LAB — CBC AND DIFFERENTIAL: WBC: 6.9

## 2018-10-05 IMAGING — US US ABDOMEN COMPLETE
1 series · 13 of 25 positions shown · non-contrast
Comparison: Abdominal ultrasound July 12, 2014

CLINICAL DATA: No acute abnormality observed elsewhere within the
abdomen.

EXAM:
ABDOMEN ULTRASOUND COMPLETE

[Series 1: us abdomen complete · 0.19mm/px · 13 of 98 slices shown]
[im 1/98]
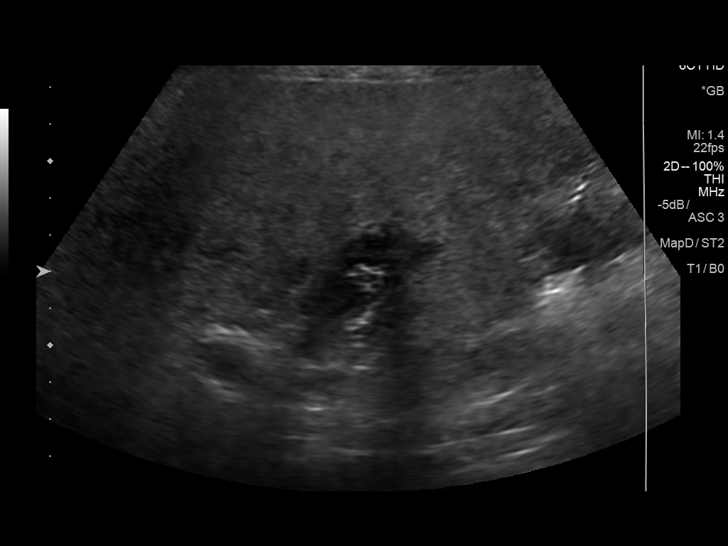
[im 9/98]
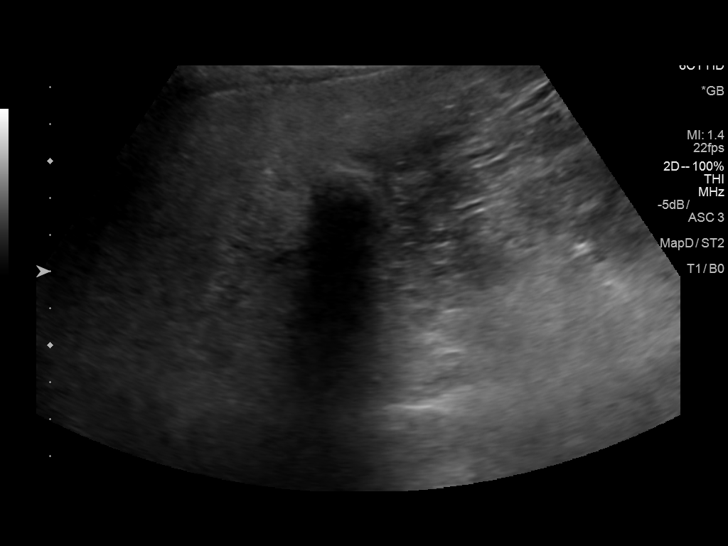
[im 17/98]
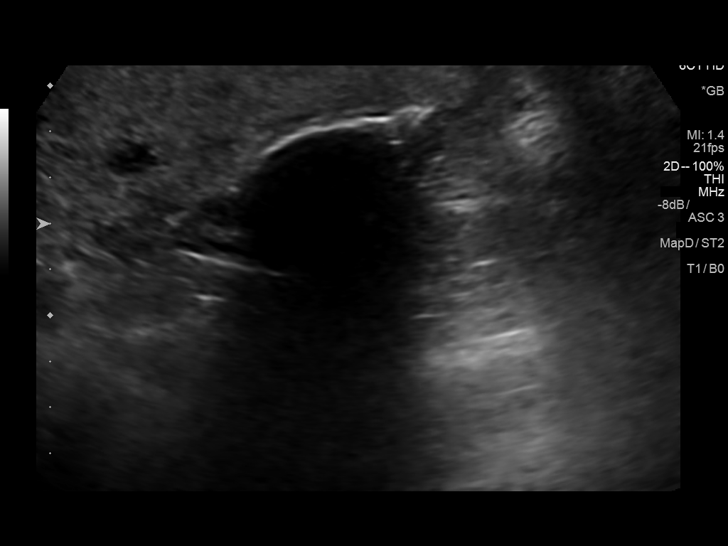
[im 25/98]
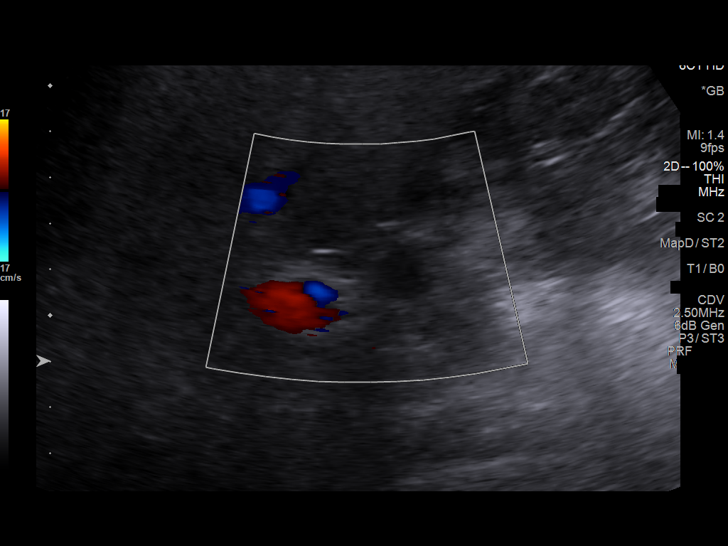
[im 33/98]
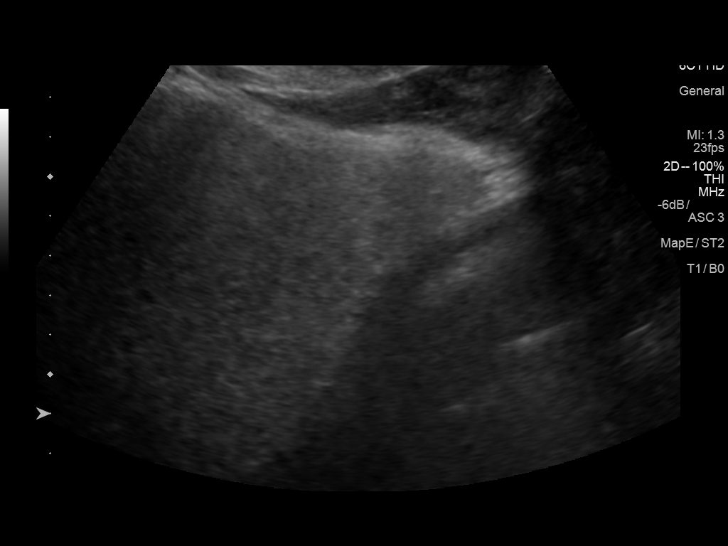
[im 41/98]
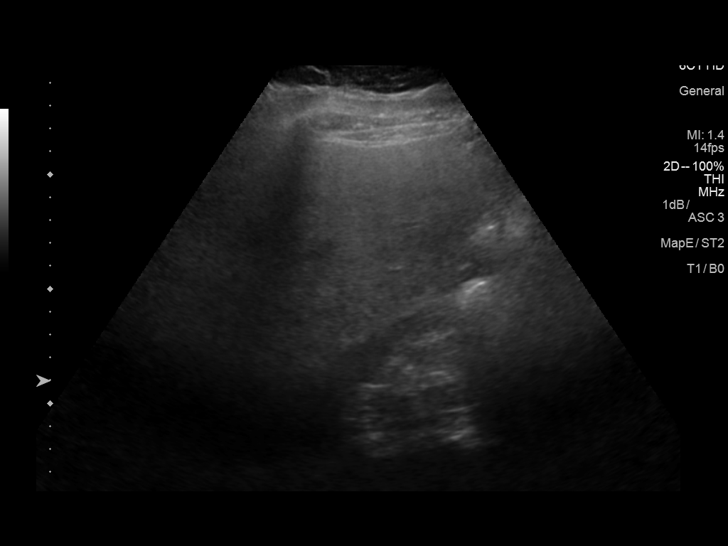
[im 49/98]
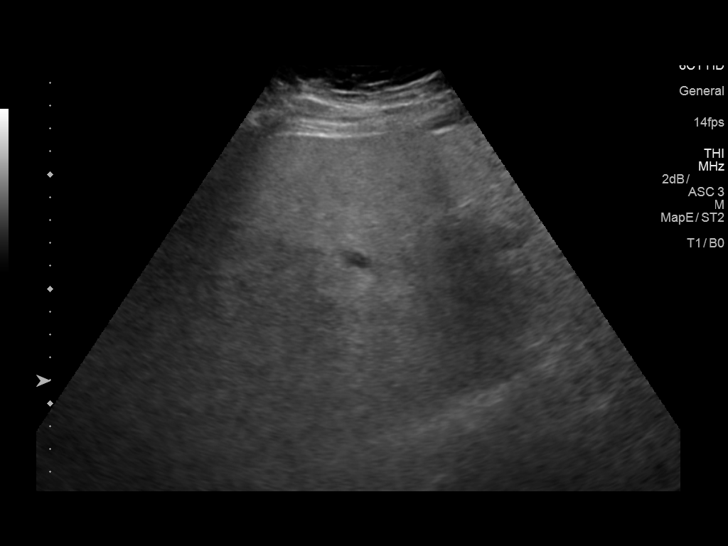
[im 57/98]
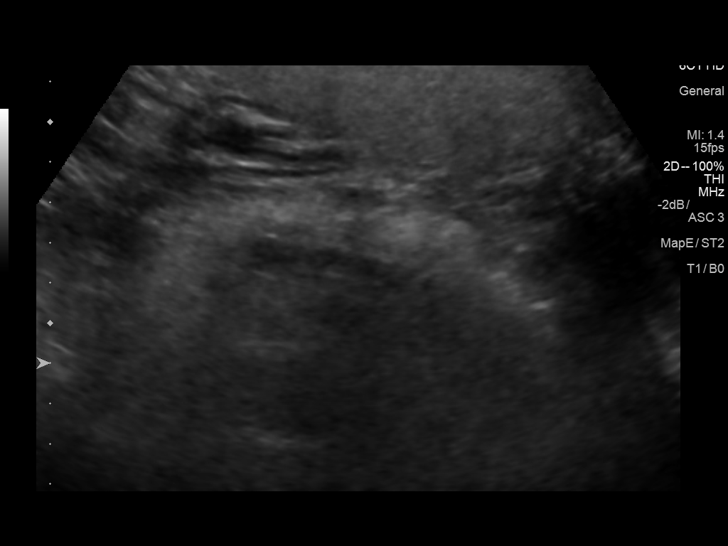
[im 65/98]
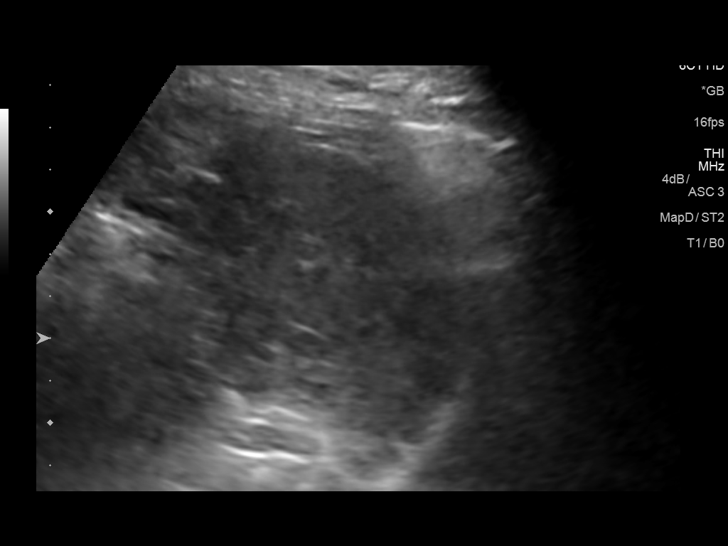
[im 73/98]
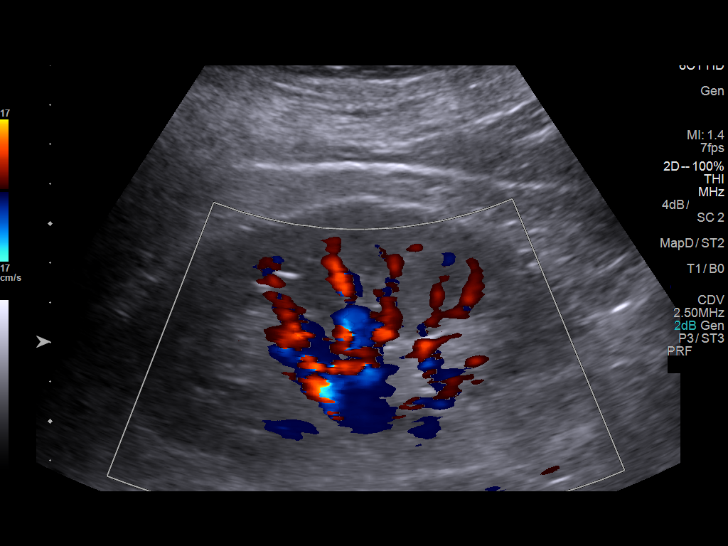
[im 81/98]
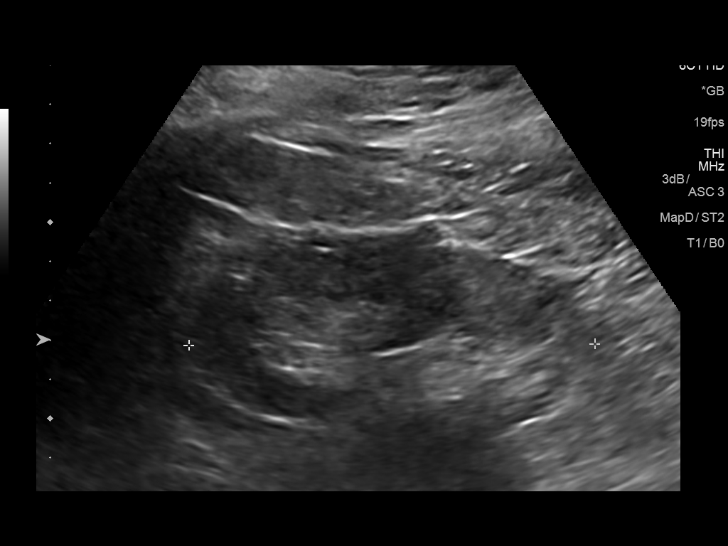
[im 89/98]
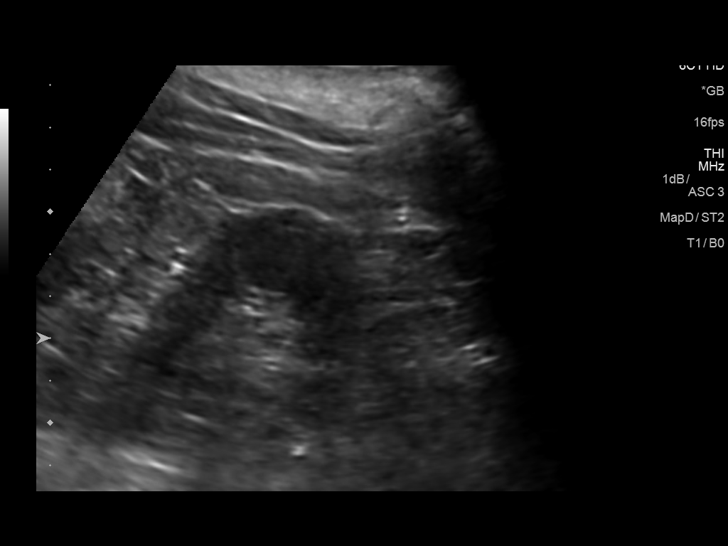
[im 98/98]
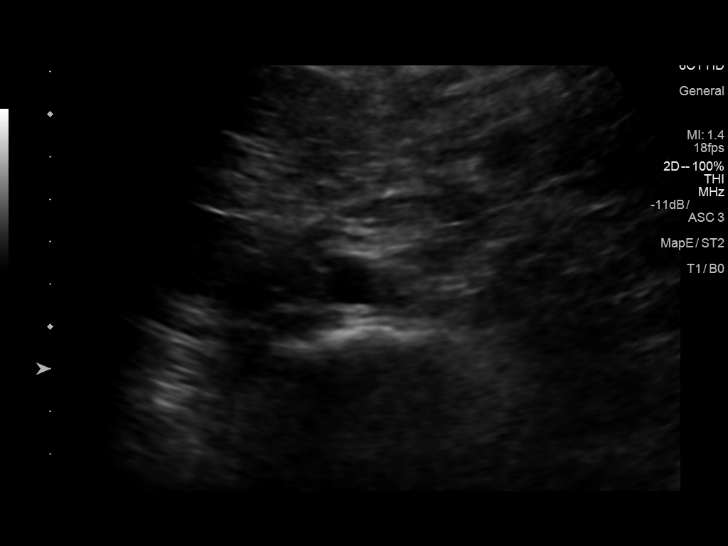

[13 of 25 positions shown; findings below may reference images not displayed]

FINDINGS: Gallbladder: The gallbladder is adequately distended. There is an
echogenic mobile stone. No sludge or wall thickening are observed.
There is no pericholecystic fluid or positive sonographic Murphy's
sign.

Common bile duct: Diameter: 3.8 mm.

Liver: The hepatic echotexture is increased diffusely. This limits
penetration by the ultrasound beam. The surface contour of the liver
is smooth. No focal mass or ductal dilation is observed. Portal vein
is patent on color Doppler imaging with normal direction of blood
flow towards the liver.

IVC: No abnormality visualized.

Pancreas: Visualized portion unremarkable.

Spleen: The spleen exhibits normal echotexture and measures 5.1 cm
in length.

Right Kidney: Length: 11.1 cm. Echogenicity within normal limits. No
mass or hydronephrosis visualized.

Left Kidney: Length: 10.3 cm. The cortical echotexture is similar to
that on the right. There is a lower pole cyst measuring 3.9 x 3.8 x
4.2 cm similar to that seen on the previous study. There is no
hydronephrosis.

Abdominal aorta: No aneurysm visualized.

Other findings: There is no ascites.
IMPRESSION: Gallstones without sonographic evidence of acute cholecystitis. The
gallstones have been described in the past.

Increased hepatic echotexture compatible with fatty infiltrative
change unchanged from the previous study.

## 2018-10-06 ENCOUNTER — Encounter: Payer: Self-pay | Admitting: Family Medicine

## 2018-10-06 ENCOUNTER — Ambulatory Visit (INDEPENDENT_AMBULATORY_CARE_PROVIDER_SITE_OTHER): Payer: BLUE CROSS/BLUE SHIELD | Admitting: Family Medicine

## 2018-10-06 ENCOUNTER — Other Ambulatory Visit: Payer: Self-pay

## 2018-10-06 VITALS — BP 132/81 | HR 81 | Temp 98.5°F | Resp 14 | Ht 63.5 in | Wt 187.6 lb

## 2018-10-06 DIAGNOSIS — Z1211 Encounter for screening for malignant neoplasm of colon: Secondary | ICD-10-CM

## 2018-10-06 DIAGNOSIS — Z0000 Encounter for general adult medical examination without abnormal findings: Secondary | ICD-10-CM

## 2018-10-06 DIAGNOSIS — K7581 Nonalcoholic steatohepatitis (NASH): Secondary | ICD-10-CM

## 2018-10-06 NOTE — Progress Notes (Signed)
Chief Complaint  Patient presents with  . Annual Exam    in good health.     Subjective:  Elizabeth Yoder is a 62 y.o. female here for a health maintenance visit.  Patient is established pt  Patient Active Problem List   Diagnosis Date Noted  . NASH (nonalcoholic steatohepatitis) 12/20/2017  . Cervical lymphadenopathy 12/20/2017  . Gallstone 06/29/2013  . Renal cyst 06/29/2013  . Renal stone 06/29/2013    Past Medical History:  Diagnosis Date  . Arthritis     Past Surgical History:  Procedure Laterality Date  . TUBAL LIGATION       Outpatient Medications Prior to Visit  Medication Sig Dispense Refill  . folic acid (FOLVITE) 1 MG tablet Take 1 mg by mouth daily.    . hydroxychloroquine (PLAQUENIL) 200 MG tablet Take 200 mg by mouth daily.    . methotrexate (RHEUMATREX) 2.5 MG tablet Take 2.5 mg by mouth once a week. Caution:Chemotherapy. Protect from light.    . predniSONE (DELTASONE) 5 MG tablet Take 5 mg by mouth once.    . sulfaSALAzine (AZULFIDINE) 500 MG tablet Take 500 mg by mouth 2 (two) times daily.    . traZODone (DESYREL) 100 MG tablet Take 100 mg by mouth at bedtime.    . naproxen (NAPROSYN) 500 MG tablet Take 1 tablet (500 mg total) by mouth 2 (two) times daily. 30 tablet 0  . diclofenac (VOLTAREN) 75 MG EC tablet Take 1 tablet (75 mg total) by mouth 2 (two) times daily. (Patient not taking: Reported on 10/06/2018) 50 tablet 2  . predniSONE (STERAPRED UNI-PAK 21 TAB) 10 MG (21) TBPK tablet Take by mouth daily. 6, 5, 4, 3, 2, 1 (Patient not taking: Reported on 10/06/2018) 21 tablet 0  . traZODone (DESYREL) 50 MG tablet Take 0.5-1 tablets (25-50 mg total) by mouth at bedtime as needed for sleep. (Patient not taking: Reported on 10/06/2018) 30 tablet 3   No facility-administered medications prior to visit.     No Known Allergies   Family History  Problem Relation Age of Onset  . Hypertension Mother   . Heart disease Mother 5       mild AMI/no stenting  .  Cancer Father        unknown cancer? stomach? tobacco  . Heart disease Father 41       AMI as cause of death  . Hypertension Sister   . Hyperlipidemia Sister      Health Habits: Dental Exam: up to date Eye Exam:  Not up to date Exercise: 5 times/week on average Current exercise activities: house work Diet: low sodium  Social History   Socioeconomic History  . Marital status: Divorced    Spouse name: Not on file  . Number of children: 1  . Years of education: Not on file  . Highest education level: Not on file  Occupational History  . Occupation: mission work  Scientific laboratory technician  . Financial resource strain: Not on file  . Food insecurity:    Worry: Not on file    Inability: Not on file  . Transportation needs:    Medical: Not on file    Non-medical: Not on file  Tobacco Use  . Smoking status: Never Smoker  . Smokeless tobacco: Never Used  Substance and Sexual Activity  . Alcohol use: No  . Drug use: No  . Sexual activity: Yes    Birth control/protection: Post-menopausal  Lifestyle  . Physical activity:    Days per  week: Not on file    Minutes per session: Not on file  . Stress: Not on file  Relationships  . Social connections:    Talks on phone: Not on file    Gets together: Not on file    Attends religious service: Not on file    Active member of club or organization: Not on file    Attends meetings of clubs or organizations: Not on file    Relationship status: Not on file  . Intimate partner violence:    Fear of current or ex partner: Not on file    Emotionally abused: Not on file    Physically abused: Not on file    Forced sexual activity: Not on file  Other Topics Concern  . Not on file  Social History Narrative   Marital status: divorced in 2009; not dating in 2018      Children: 1 son; 2 grandchildren; 1 gg.       Lives: with son       Employment;  [employer] work in 2018      Tobacco: none      Alcohol: none      Drugs: none         Social History    Substance and Sexual Activity  Alcohol Use No   Social History   Tobacco Use  Smoking Status Never Smoker  Smokeless Tobacco Never Used   Social History   Substance and Sexual Activity  Drug Use No    GYN: Sexual Health Menstrual status: regular menses LMP: No LMP recorded. Patient is postmenopausal. Last pap smear: see HM section History of abnormal pap smears:    Health Maintenance: See under health Maintenance activity for review of completion dates as well. Immunization History  Administered Date(s) Administered  . Td 06/20/2013      Depression Screen-PHQ2/9 Depression screen [facility] 2/9 10/06/2018 12/10/2017 12/05/2017 12/02/2017 10/10/2017  Decreased Interest 0 0 0 0 0  Down, Depressed, Hopeless 0 0 0 0 0  PHQ - 2 Score 0 0 0 0 0       Depression Severity and Treatment Recommendations:  0-4= None  5-9= Mild / Treatment: Support, educate to call if worse; return in one month  10-14= Moderate / Treatment: Support, watchful waiting; Antidepressant or Psycotherapy  15-19= Moderately severe / Treatment: Antidepressant OR Psychotherapy  >= 20 = Major depression, severe / Antidepressant AND Psychotherapy    Review of Systems   Review of Systems  Constitutional: Negative for chills and fever.  HENT: Negative for congestion, nosebleeds and sinus pain.   Eyes: Negative for blurred vision and double vision.  Respiratory: Negative for cough, shortness of breath and wheezing.   Cardiovascular: Negative for chest pain and palpitations.  Gastrointestinal: Negative for abdominal pain, nausea and vomiting.  Skin: Negative for itching and rash.  Neurological: Negative for dizziness, tingling and headaches.  Psychiatric/Behavioral: Negative for depression. The patient is not nervous/anxious.     See HPI for ROS as well.    Objective:   Vitals:   10/06/18 0910  BP: 132/81  Pulse: 81  Resp: 14  Temp: 98.5 F (36.9 C)  TempSrc: Oral  SpO2: 96%  Weight: 187 lb  9.6 oz (85.1 kg)  Height: 5' 3.5" (1.613 m)    Body mass index is 32.71 kg/m.  Physical Exam  Constitutional: She is oriented to person, place, and time. She appears well-developed and well-nourished.  HENT:  Head: Normocephalic and atraumatic.  Right Ear: External ear  normal.  Left Ear: External ear normal.  Mouth/Throat: Oropharynx is clear and moist.  Eyes: Conjunctivae and EOM are normal.  Neck: Normal range of motion. Neck supple.  Cardiovascular: Normal rate, regular rhythm, normal heart sounds and intact distal pulses.  No murmur heard. Pulmonary/Chest: Effort normal and breath sounds normal. No stridor. No respiratory distress. She has no wheezes.  Abdominal: Soft. Bowel sounds are normal. She exhibits no distension. There is no tenderness. There is no guarding.  Musculoskeletal: Normal range of motion. She exhibits no edema.  Neurological: She is alert and oriented to person, place, and time.  Skin: Skin is warm. Capillary refill takes less than 2 seconds.  Psychiatric: She has a normal mood and affect. Her behavior is normal. Judgment and thought content normal.       Assessment/Plan:   Patient was seen for a health maintenance exam.  Counseled the patient on health maintenance issues. Reviewed her health mainteance schedule and ordered appropriate tests (see orders.) Counseled on regular exercise and weight management. Recommend regular eye exams and dental cleaning.   The following issues were addressed today for health maintenance:   Elizabeth Yoder was seen today for annual exam.  Diagnoses and all orders for this visit:  Health maintenance examination- Women's Health Maintenance Plan Advised monthly breast exam and annual mammogram Advised dental exam every six months Discussed stress management Discussed pap smear screening guidelines    NASH (nonalcoholic steatohepatitis) -     Cancel: COMPLETE METABOLIC PANEL WITH GFR -     Lipid panel -      CMP14+EGFR  Special screening for malignant neoplasms, colon -     Cologuard    No follow-ups on file.    Body mass index is 32.71 kg/m.:  Discussed the patient's BMI with patient. The BMI body mass index is 32.71 kg/m.     No future appointments.  Patient Instructions   Please let Dr. Ronita Hipps know that we would like a copy of the Mammogram, Bone Density and Pap Smear results for our records      If you have lab work done today you will be contacted with your lab results within the next 2 weeks.  If you have not heard from [place] then please contact us. The fastest way to get your results is to register for My Chart.   IF you received an x-ray today, you will receive an invoice from [facility]. Please contact [facility] at [phone] with questions or concerns regarding your invoice.   IF you received labwork today, you will receive an invoice from [facility]. Please contact [facility] at [phone] with questions or concerns regarding your invoice.   Our billing staff will not be able to assist you with questions regarding bills from these companies.  You will be contacted with the lab results as soon as they are available. The fastest way to get your results is to activate your My Chart account. Instructions are located on the last page of this paperwork. If you have not heard from [place] regarding the results in 2 weeks, please contact this office.     Fatty Liver Fatty liver, also called hepatic steatosis or steatohepatitis, is a condition in which too much fat has built up in your liver cells. The liver removes harmful substances from your bloodstream. It produces fluids your body needs. It also helps your body use and store energy from the food you eat. In many cases, fatty liver does not cause symptoms or problems. It is  often diagnosed when tests are being done for other reasons. However, over time, fatty liver can cause inflammation that may lead to more  serious liver problems, such as scarring of the liver (cirrhosis). What are the causes? Causes of fatty liver may include:  Drinking too much alcohol.  Poor nutrition.  Obesity.  Cushing syndrome.  Diabetes.  Hyperlipidemia.  Pregnancy.  Certain drugs.  Poisons.  Some viral infections.  What increases the risk? You may be more likely to develop fatty liver if you:  Abuse alcohol.  Are pregnant.  Are overweight.  Have diabetes.  Have hepatitis.  Have a high triglyceride level.  What are the signs or symptoms? Fatty liver often does not cause any symptoms. In cases where symptoms develop, they can include:  Fatigue.  Weakness.  Weight loss.  Confusion.  Abdominal pain.  Yellowing of your skin and the white parts of your eyes (jaundice).  Nausea and vomiting.  How is this diagnosed? Fatty liver may be diagnosed by:  Physical exam and medical history.  Blood tests.  Imaging tests, such as an ultrasound, CT scan, or MRI.  Liver biopsy. A small sample of liver tissue is removed using a needle. The sample is then looked at under a microscope.  How is this treated? Fatty liver is often caused by other health conditions. Treatment for fatty liver may involve medicines and lifestyle changes to manage conditions such as:  Alcoholism.  High cholesterol.  Diabetes.  Being overweight or obese.  Follow these instructions at home:  Eat a healthy diet as directed by your health care provider.  Exercise regularly. This can help you lose weight and control your cholesterol and diabetes. Talk to your health care provider about an exercise plan and which activities are best for you.  Do not drink alcohol.  Take medicines only as directed by your health care provider. Contact a health care provider if: You have difficulty controlling your:  Blood sugar.  Cholesterol.  Alcohol consumption.  Get help right away if:  You have abdominal  pain.  You have jaundice.  You have nausea and vomiting. This information is not intended to replace advice given to you by your health care provider. Make sure you discuss any questions you have with your health care provider. Document Released: 12/31/2005 Document Revised: 04/22/2016 Document Reviewed: 03/27/2014 Elsevier Interactive Patient Education  [employer].

## 2018-10-06 NOTE — Patient Instructions (Addendum)
Please let Dr. Ronita Hipps know that we would like a copy of the Mammogram, Bone Density and Pap Smear results for our records      If you have lab work done today you will be contacted with your lab results within the next 2 weeks.  If you have not heard from [place] then please contact us. The fastest way to get your results is to register for My Chart.   IF you received an x-ray today, you will receive an invoice from [facility]. Please contact [facility] at [phone] with questions or concerns regarding your invoice.   IF you received labwork today, you will receive an invoice from [place]. Please contact [facility] at [phone] with questions or concerns regarding your invoice.   Our billing staff will not be able to assist you with questions regarding bills from these companies.  You will be contacted with the lab results as soon as they are available. The fastest way to get your results is to activate your My Chart account. Instructions are located on the last page of this paperwork. If you have not heard from [place] regarding the results in 2 weeks, please contact this office.     Fatty Liver Fatty liver, also called hepatic steatosis or steatohepatitis, is a condition in which too much fat has built up in your liver cells. The liver removes harmful substances from your bloodstream. It produces fluids your body needs. It also helps your body use and store energy from the food you eat. In many cases, fatty liver does not cause symptoms or problems. It is often diagnosed when tests are being done for other reasons. However, over time, fatty liver can cause inflammation that may lead to more serious liver problems, such as scarring of the liver (cirrhosis). What are the causes? Causes of fatty liver may include:  Drinking too much alcohol.  Poor nutrition.  Obesity.  Cushing syndrome.  Diabetes.  Hyperlipidemia.  Pregnancy.  Certain drugs.  Poisons.  Some  viral infections.  What increases the risk? You may be more likely to develop fatty liver if you:  Abuse alcohol.  Are pregnant.  Are overweight.  Have diabetes.  Have hepatitis.  Have a high triglyceride level.  What are the signs or symptoms? Fatty liver often does not cause any symptoms. In cases where symptoms develop, they can include:  Fatigue.  Weakness.  Weight loss.  Confusion.  Abdominal pain.  Yellowing of your skin and the white parts of your eyes (jaundice).  Nausea and vomiting.  How is this diagnosed? Fatty liver may be diagnosed by:  Physical exam and medical history.  Blood tests.  Imaging tests, such as an ultrasound, CT scan, or MRI.  Liver biopsy. A small sample of liver tissue is removed using a needle. The sample is then looked at under a microscope.  How is this treated? Fatty liver is often caused by other health conditions. Treatment for fatty liver may involve medicines and lifestyle changes to manage conditions such as:  Alcoholism.  High cholesterol.  Diabetes.  Being overweight or obese.  Follow these instructions at home:  Eat a healthy diet as directed by your health care provider.  Exercise regularly. This can help you lose weight and control your cholesterol and diabetes. Talk to your health care provider about an exercise plan and which activities are best for you.  Do not drink alcohol.  Take medicines only as directed by your health care provider. Contact a health care provider  if: You have difficulty controlling your:  Blood sugar.  Cholesterol.  Alcohol consumption.  Get help right away if:  You have abdominal pain.  You have jaundice.  You have nausea and vomiting. This information is not intended to replace advice given to you by your health care provider. Make sure you discuss any questions you have with your health care provider. Document Released: 12/31/2005 Document Revised: 04/22/2016  Document Reviewed: 03/27/2014 Elsevier Interactive Patient Education  [employer].

## 2018-10-07 LAB — LIPID PANEL
Chol/HDL Ratio: 2.1 ratio (ref 0.0–4.4)
Cholesterol, Total: 222 mg/dL — ABNORMAL HIGH (ref 100–199)
HDL: 105 mg/dL (ref >39)
LDL CALC: 105 mg/dL — AB (ref 0–99)
Triglycerides: 59 mg/dL (ref 0–149)
VLDL Cholesterol Cal: 12 mg/dL (ref 5–40)

## 2018-10-07 LAB — CMP14+EGFR
ALBUMIN: 4.2 g/dL (ref 3.6–4.8)
ALK PHOS: 71 IU/L (ref 39–117)
ALT: 16 IU/L (ref 0–32)
AST: 18 IU/L (ref 0–40)
Albumin/Globulin Ratio: 1.8 (ref 1.2–2.2)
BUN / CREAT RATIO: 31 — AB (ref 12–28)
BUN: 22 mg/dL (ref 8–27)
Bilirubin Total: 0.2 mg/dL (ref 0.0–1.2)
CALCIUM: 9.3 mg/dL (ref 8.7–10.3)
CO2: 22 mmol/L (ref 20–29)
CREATININE: 0.72 mg/dL (ref 0.57–1.00)
Chloride: 105 mmol/L (ref 96–106)
GFR calc non Af Amer: 90 mL/min/{1.73_m2} (ref >59)
GFR, EST AFRICAN AMERICAN: 104 mL/min/{1.73_m2} (ref >59)
GLOBULIN, TOTAL: 2.3 g/dL (ref 1.5–4.5)
GLUCOSE: 84 mg/dL (ref 65–99)
Potassium: 4 mmol/L (ref 3.5–5.2)
SODIUM: 144 mmol/L (ref 134–144)
TOTAL PROTEIN: 6.5 g/dL (ref 6.0–8.5)

## 2018-10-10 ENCOUNTER — Encounter: Payer: Self-pay | Admitting: Family Medicine

## 2018-11-01 ENCOUNTER — Encounter: Payer: Self-pay | Admitting: Family Medicine

## 2019-06-13 ENCOUNTER — Telehealth: Payer: Self-pay | Admitting: General Practice

## 2019-06-13 NOTE — Telephone Encounter (Signed)
Copied from [place] [id number]. Topic: Medical Record Request - Patient ROI Request >> Jun 13, 2019  3:23 PM Nathanial Millman J wrote: Patient Name/DOB/MRN #: Elizabeth Yoder - 15-Oct-2056 - [id number] Requestor Name/Agency: Self Call Back #: [phone] Information Requested: pt is asking for records to go to [place] [phone]    Route to [place] for [place] clinics. For all other clinics, route to the clinic's PEC Pool.

## 2019-06-13 NOTE — Telephone Encounter (Signed)
Copied from [place] [id number]. Topic: Medical Record Request - Patient ROI Request >> Jun 13, 2019  3:23 PM Nathanial Millman J wrote: Patient Name/DOB/MRN #: Elizabeth Yoder - 01/14/2056 - [id number] Requestor Name/Agency: Self Call Back #: [phone] Information Requested: pt is asking for records to go to [place] [phone]    Route to [place] for [place] clinics. For all other clinics, route to the clinic's PEC Pool.

## 2019-06-14 NOTE — Telephone Encounter (Signed)
She filled a release out this same day 06/13/19. I have called her and let her know her records were ready for pickup.

## 2024-10-22 ENCOUNTER — Emergency Department (HOSPITAL_COMMUNITY)

## 2024-10-22 ENCOUNTER — Other Ambulatory Visit: Payer: Self-pay

## 2024-10-22 ENCOUNTER — Encounter (HOSPITAL_COMMUNITY): Payer: Self-pay

## 2024-10-22 ENCOUNTER — Observation Stay (HOSPITAL_COMMUNITY)
Admission: EM | Admit: 2024-10-22 | Discharge: 2024-10-24 | Disposition: A | Discharge status: Home / Self Care | Attending: Family Medicine | Admitting: Family Medicine

## 2024-10-22 DIAGNOSIS — Z789 Other specified health status: Secondary | ICD-10-CM

## 2024-10-22 DIAGNOSIS — S82851A Displaced trimalleolar fracture of right lower leg, initial encounter for closed fracture: Principal | ICD-10-CM | POA: Insufficient documentation

## 2024-10-22 DIAGNOSIS — M069 Rheumatoid arthritis, unspecified: Secondary | ICD-10-CM | POA: Diagnosis not present

## 2024-10-22 DIAGNOSIS — G4700 Insomnia, unspecified: Secondary | ICD-10-CM | POA: Diagnosis not present

## 2024-10-22 DIAGNOSIS — W19XXXA Unspecified fall, initial encounter: Secondary | ICD-10-CM | POA: Diagnosis not present

## 2024-10-22 DIAGNOSIS — S82899A Other fracture of unspecified lower leg, initial encounter for closed fracture: Secondary | ICD-10-CM | POA: Diagnosis present

## 2024-10-22 DIAGNOSIS — S82891A Other fracture of right lower leg, initial encounter for closed fracture: Principal | ICD-10-CM

## 2024-10-22 DIAGNOSIS — Z7982 Long term (current) use of aspirin: Secondary | ICD-10-CM | POA: Diagnosis not present

## 2024-10-22 DIAGNOSIS — M25571 Pain in right ankle and joints of right foot: Secondary | ICD-10-CM | POA: Diagnosis present

## 2024-10-22 DIAGNOSIS — R7989 Other specified abnormal findings of blood chemistry: Secondary | ICD-10-CM | POA: Diagnosis not present

## 2024-10-22 HISTORY — DX: Rheumatoid arthritis, unspecified: M06.9

## 2024-10-22 LAB — CBC WITH DIFFERENTIAL/PLATELET
Abs Immature Granulocytes: 0.03 K/uL (ref 0.00–0.07)
Basophils Absolute: 0 K/uL (ref 0.0–0.1)
Basophils Relative: 0 %
Eosinophils Absolute: 0.1 K/uL (ref 0.0–0.5)
Eosinophils Relative: 2 %
HCT: 40.6 % (ref 36.0–46.0)
Hemoglobin: 13.1 g/dL (ref 12.0–15.0)
Immature Granulocytes: 0 %
Lymphocytes Relative: 13 %
Lymphs Abs: 0.9 K/uL (ref 0.7–4.0)
MCH: 31 pg (ref 26.0–34.0)
MCHC: 32.3 g/dL (ref 30.0–36.0)
MCV: 96.2 fL (ref 80.0–100.0)
Monocytes Absolute: 0.6 K/uL (ref 0.1–1.0)
Monocytes Relative: 9 %
Neutro Abs: 5.1 K/uL (ref 1.7–7.7)
Neutrophils Relative %: 76 %
Platelets: 166 K/uL (ref 150–400)
RBC: 4.22 MIL/uL (ref 3.87–5.11)
RDW: 14.3 % (ref 11.5–15.5)
WBC: 6.7 K/uL (ref 4.0–10.5)
nRBC: 0 % (ref 0.0–0.2)

## 2024-10-22 LAB — BASIC METABOLIC PANEL WITH GFR
Anion gap: 9 (ref 5–15)
BUN: 20 mg/dL (ref 8–23)
CO2: 27 mmol/L (ref 22–32)
Calcium: 8.7 mg/dL — ABNORMAL LOW (ref 8.9–10.3)
Chloride: 103 mmol/L (ref 98–111)
Creatinine, Ser: 1.04 mg/dL — ABNORMAL HIGH (ref 0.44–1.00)
GFR, Estimated: 59 mL/min — ABNORMAL LOW (ref >60)
Glucose, Bld: 118 mg/dL — ABNORMAL HIGH (ref 70–99)
Potassium: 4.1 mmol/L (ref 3.5–5.1)
Sodium: 139 mmol/L (ref 135–145)

## 2024-10-22 MED ORDER — POLYETHYLENE GLYCOL 3350 17 G PO PACK: 17.0000 g | PACK | Freq: Every day | ORAL | Status: DC | PRN

## 2024-10-22 MED ORDER — PROPOFOL 10 MG/ML IV BOLUS
1.0000 mg/kg | Freq: Once | INTRAVENOUS | Status: DC
  Filled 2024-10-22: qty 20

## 2024-10-22 MED ORDER — ONDANSETRON 4 MG PO TBDP
4.0000 mg | ORAL_TABLET | Freq: Three times a day (TID) | ORAL | Status: DC | PRN
  Administered 2024-10-24: 4 mg via ORAL
  Filled 2024-10-22: qty 1

## 2024-10-22 MED ORDER — TRAZODONE HCL 50 MG PO TABS
100.0000 mg | ORAL_TABLET | Freq: Every day | ORAL | Status: DC
  Administered 2024-10-22 – 2024-10-23 (×2): 100 mg via ORAL
  Filled 2024-10-22 (×2): qty 2

## 2024-10-22 MED ORDER — FENTANYL CITRATE (PF) 50 MCG/ML IJ SOSY
50.0000 ug | PREFILLED_SYRINGE | Freq: Once | INTRAMUSCULAR | Status: AC
  Administered 2024-10-22: 50 ug via INTRAVENOUS
  Filled 2024-10-22: qty 1

## 2024-10-22 MED ORDER — ONDANSETRON HCL 4 MG/2ML IJ SOLN
4.0000 mg | Freq: Once | INTRAMUSCULAR | Status: AC
  Administered 2024-10-22: 4 mg via INTRAVENOUS
  Filled 2024-10-22: qty 2

## 2024-10-22 MED ORDER — HYDROMORPHONE HCL 1 MG/ML IJ SOLN
0.5000 mg | INTRAMUSCULAR | Status: DC | PRN
  Administered 2024-10-22: 1 mg via INTRAVENOUS
  Administered 2024-10-23: 0.5 mg via INTRAVENOUS
  Filled 2024-10-22 (×2): qty 1

## 2024-10-22 MED ORDER — ONDANSETRON 4 MG PO TBDP: 4.0000 mg | ORAL_TABLET | Freq: Three times a day (TID) | ORAL | Status: DC | PRN

## 2024-10-22 MED ORDER — ONDANSETRON HCL 4 MG/2ML IJ SOLN
4.0000 mg | Freq: Three times a day (TID) | INTRAMUSCULAR | Status: DC | PRN
  Administered 2024-10-22 – 2024-10-23 (×3): 4 mg via INTRAVENOUS
  Filled 2024-10-22 (×3): qty 2

## 2024-10-22 MED ORDER — ONDANSETRON HCL 4 MG/2ML IJ SOLN
4.0000 mg | Freq: Three times a day (TID) | INTRAMUSCULAR | Status: DC | PRN
Start: 2024-10-22 — End: 2024-10-22

## 2024-10-22 MED ORDER — HYDROCODONE-ACETAMINOPHEN 5-325 MG PO TABS
1.0000 | ORAL_TABLET | Freq: Once | ORAL | Status: AC
  Administered 2024-10-22: 1 via ORAL
  Filled 2024-10-22: qty 1

## 2024-10-22 MED ORDER — SENNA 8.6 MG PO TABS
1.0000 | ORAL_TABLET | Freq: Every day | ORAL | Status: DC
  Administered 2024-10-23: 8.6 mg via ORAL
  Filled 2024-10-22: qty 1

## 2024-10-22 MED ORDER — ACETAMINOPHEN 325 MG PO TABS
650.0000 mg | ORAL_TABLET | Freq: Four times a day (QID) | ORAL | Status: DC
  Administered 2024-10-22: 650 mg via ORAL
  Filled 2024-10-22 (×2): qty 2

## 2024-10-22 MED ORDER — FENTANYL CITRATE (PF) 50 MCG/ML IJ SOSY: 50.0000 ug | PREFILLED_SYRINGE | Freq: Once | INTRAMUSCULAR | Status: AC

## 2024-10-22 MED ORDER — SODIUM CHLORIDE 0.9 % IV BOLUS
1000.0000 mL | Freq: Once | INTRAVENOUS | Status: AC
  Administered 2024-10-22: 1000 mL via INTRAVENOUS

## 2024-10-22 MED ORDER — ACETAMINOPHEN 650 MG RE SUPP: 650.0000 mg | Freq: Four times a day (QID) | RECTAL | Status: DC

## 2024-10-22 MED ORDER — FENTANYL CITRATE (PF) 50 MCG/ML IJ SOSY
PREFILLED_SYRINGE | INTRAMUSCULAR | Status: AC
  Administered 2024-10-22: 50 ug via INTRAVENOUS
  Filled 2024-10-22: qty 1

## 2024-10-22 MED ORDER — OXYCODONE HCL 5 MG PO TABS
5.0000 mg | ORAL_TABLET | ORAL | Status: DC | PRN
  Administered 2024-10-23 – 2024-10-24 (×2): 5 mg via ORAL
  Filled 2024-10-22: qty 1

## 2024-10-22 MED ORDER — PROPOFOL 10 MG/ML IV BOLUS
INTRAVENOUS | Status: AC | PRN
  Administered 2024-10-22 (×3): 20 mg via INTRAVENOUS
  Administered 2024-10-22: 40 mg via INTRAVENOUS
  Administered 2024-10-22: 20 mg via INTRAVENOUS

## 2024-10-22 NOTE — ED Notes (Signed)
 Patient transported to CT

## 2024-10-22 NOTE — Progress Notes (Signed)
 Orthopedic Tech Progress Note Patient Details:  Amyrah Pinkhasov [facility] [birth year] [id number] Applied splint after reduction of ankle per PA [facility] Type of Ortho Device: Short leg splint Ortho Device/Splint Location: RLE Ortho Device/Splint Interventions: Ordered, Application   Post Interventions Patient Tolerated: Well  Elizeo Rodriques A Charleigh Correnti 10/22/2024, 4:14 PM

## 2024-10-22 NOTE — Plan of Care (Signed)
 FMTS Brief Progress Note  S: Went to see patient at bedside as part of night rounds.  She reports she is overall doing well, pain currently present in right shin region, but much improved from when she first arrived.  Reports her pain was 11-12/10 in severity prior to receiving pain medication around 6:30 PM, now improved.  She does report having a low pain tolerance and has needed abundant amounts of pain medication with prior procedures.  She is worried about her current pain being controlled, though it is currently.  Emphasized goal is not for patient to have no pain, but to have bearable pain.  No other concerns.   O: BP 136/70   Pulse 80   Temp 98.4 F (36.9 C)   Resp 12   Ht 5' 3 (1.6 m)   Wt 81.6 kg   SpO2 97%   BMI 31.89 kg/m    Physical exam General: Patient lying down in bed, right foot propped up, no acute distress. Cardiovascular: Regular rate and rhythm, no murmurs/rubs/gallops. Respiratory: Normal work of breathing on room air. Clear to auscultation bilaterally; no wheezes, crackles. Abdomen: Bowel sounds present and normoactive bilaterally. Soft, nondistended, nontender. Extremities: Skin warm, dry of left lower extremity. No left lower extremity edema.  Right leg wrapped.  Patient able to wiggle right toes.  Skin warm, dry.  DP pulse palpated in right foot.  A/P: Ankle Fracture - NPO at midnight for orthopedic surgery tomorrow - Continue current pain regimen of Tylenol  650 mg q6h, oxycodone  5 mg q4h prn, dilaudud 0.5-1 mg q2h prn  - If pain not well-controlled overnight, can consider increasing Tylenol  dose versus increasing oxycodone  dose - Orders reviewed. Labs for AM ordered, which was adjusted as needed.  - Rest per H&P from earlier today  Larraine Palma, MD 10/22/2024, 7:58 PM PGY-1, [facility] Night Resident  Please page [phone] with questions.

## 2024-10-22 NOTE — Assessment & Plan Note (Signed)
-   Admit to FMTS, attending Dr. Donah  - MedSurg, Vital signs per floor - Consults: Ortho - Pain control: Tylenol  650 mg q6h, oxycodone  5 mg q4h prn, dilaudud 0.5-1 mg q2h prn - Bowel Reg: Miralax , Senna  - AM Labs: BMP, CBC - Fall precautions - PT/OT consult s/p surgery

## 2024-10-22 NOTE — ED Triage Notes (Signed)
 Pt BIB EMS after pt fell in yard. Pt complaining of right sided ankle and leg pain. Pt has visible deformity to right ankle. Pedal pulses palpable on right side. Pt Aox4 upon arrival.   175 mcg fentanyl  and 4 of zofran  given by EMS en route

## 2024-10-22 NOTE — ED Provider Notes (Addendum)
 [place] EMERGENCY DEPARTMENT AT [facility] Provider Note   CSN: [id number] Arrival date & time: 10/22/24  1422     Patient presents with: No chief complaint on file.   Elizabeth Yoder is a 68 y.o. female.   HPI   68 year old female presenting to the emergency department with concern for right ankle fracture dislocation.  The patient states that she fell twisting her ankle in the yard sustaining immediate ankle and leg pain.  She sustained a deformity to the right ankle.  She had palpable pulses along the dorsalis pedis with EMS on the right.  She denies head trauma, denies loss of consciousness, denies any other injuries or complaints.  Was administered 75 mcg of fentanyl  and 4 mg of Zofran  by EMS and route.   Prior to Admission medications   Medication Sig Start Date End Date Taking? Authorizing Provider  folic acid (FOLVITE) 1 MG tablet Take 1 mg by mouth daily.    [provider]  hydroxychloroquine (PLAQUENIL) 200 MG tablet Take 200 mg by mouth daily.    [provider]  methotrexate (RHEUMATREX) 2.5 MG tablet Take 2.5 mg by mouth once a week. Caution:Chemotherapy. Protect from light.    [provider]  naproxen  (NAPROSYN ) 500 MG tablet Take 1 tablet (500 mg total) by mouth 2 (two) times daily. 12/19/17   Petrucelli, Samantha R, PA-C  sulfaSALAzine (AZULFIDINE) 500 MG tablet Take 500 mg by mouth 2 (two) times daily.    [provider]  traZODone  (DESYREL ) 100 MG tablet Take 100 mg by mouth at bedtime.    [provider]    Allergies: Patient has no known allergies.    Review of Systems  All other systems reviewed and are negative.   Updated Vital Signs BP 136/70   Pulse 80   Temp (!) 97.5 F (36.4 C) (Oral)   Resp 12   Ht 5' 3 (1.6 m)   Wt 81.6 kg   SpO2 97%   BMI 31.89 kg/m   Physical Exam Vitals and nursing note reviewed.  Constitutional:      General: She is not in acute distress.    Appearance: She  is well-developed.     Comments: GCS 15, ABC intact  HENT:     Head: Normocephalic and atraumatic.  Eyes:     Extraocular Movements: Extraocular movements intact.     Conjunctiva/sclera: Conjunctivae normal.     Pupils: Pupils are equal, round, and reactive to light.  Neck:     Comments: No midline tenderness to palpation of the cervical spine.  Range of motion intact Cardiovascular:     Rate and Rhythm: Normal rate and regular rhythm.     Heart sounds: No murmur heard. Pulmonary:     Effort: Pulmonary effort is normal. No respiratory distress.     Breath sounds: Normal breath sounds.  Chest:     Comments: Clavicles stable nontender to AP compression.  Chest wall stable and nontender to AP and lateral compression. Abdominal:     General: There is no distension.     Palpations: Abdomen is soft.     Tenderness: There is no abdominal tenderness. There is no guarding.     Comments: Pelvis stable to lateral compression  Musculoskeletal:        General: Swelling, tenderness, deformity and signs of injury present.     Cervical back: Neck supple.     Comments: 2+ DP pulses, deformity of the right ankle with associated tenderness  concerning for fracture or dislocation  Skin:    General: Skin is warm and dry.     Findings: No lesion or rash.  Neurological:     General: No focal deficit present.     Mental Status: She is alert. Mental status is at baseline.     Comments: Cranial nerves II through XII grossly intact.  Moving all 4 extremities spontaneously.  Sensation grossly intact all 4 extremities     (all labs ordered are listed, but only abnormal results are displayed) Labs Reviewed  BASIC METABOLIC PANEL WITH GFR - Abnormal; Notable for the following components:      Result Value   Glucose, Bld 118 (*)    Creatinine, Ser 1.04 (*)    Calcium  8.7 (*)    GFR, Estimated 59 (*)    All other components within normal limits  CBC WITH DIFFERENTIAL/PLATELET  HIV ANTIBODY (ROUTINE  TESTING W REFLEX)  BASIC METABOLIC PANEL WITH GFR  CBC    EKG: None  Radiology: DG Ankle Complete Right Result Date: 10/22/2024 CLINICAL DATA:  Fracture dislocation status post reduction EXAM: RIGHT ANKLE - COMPLETE 3+ VIEW COMPARISON:  10/22/2024 FINDINGS: Frontal, oblique, and lateral views of the right ankle are obtained. Cast material obscures underlying bony detail. There is been significant reduction of the medial and lateral malleolar fracture fragments since prior study, with near anatomic alignment. There is moderate residual distraction at the posterior malleolar fracture site, with separation of approximately 8 mm. There is severe dorsal subluxation of the talus relative to the tibial plafond, without frank dislocation. IMPRESSION: 1. Reduction of the trimalleolar fracture dislocation seen previously. Severe dorsal subluxation of the tibiotalar joint without frank dislocation. Electronically Signed   By: Ozell Daring M.D.   On: 10/22/2024 17:16   DG Ankle Complete Right Result Date: 10/22/2024 EXAM: 3 OR MORE VIEW(S) XRAY OF THE ANKLE 10/22/2024 02:53:00 PM CLINICAL HISTORY: fall, ankle fracture COMPARISON: None available. FINDINGS: BONES AND JOINTS: Significantly displaced, trimalleolar fracture or dislocation of the ankle. Obliquely oriented fracture through the distal fibula, which is posteriorly and laterally displaced 1.1 cm laterally and 1.5 cm posteriorly. The medial malleolus is distracted 1.3 cm from the distal tibia. Approximately 2.5 cm of lateral dislocation of the talus with respect to the tibia. The talus is also displaced 2.5 cm posteriorly. Likely posterior malleolar fracture also present, but not well appreciated. SOFT TISSUES: Moderate soft tissue swelling about the ankle and calf. IMPRESSION: 1. Significantly displaced trimalleolar fracture-dislocation of the ankle, as described above. Electronically signed by: Rogelia Myers MD 10/22/2024 03:24 PM EST RP Workstation:  [id number]     .Sedation  Date/Time: 10/22/2024 3:28 PM  Performed by: Jerrol Agent, MD Authorized by: Jerrol Agent, MD   Consent:    Consent obtained:  Verbal   Consent given by:  Patient   Risks discussed:  Allergic reaction, dysrhythmia, inadequate sedation, nausea, prolonged hypoxia resulting in organ damage, prolonged sedation necessitating reversal, respiratory compromise necessitating ventilatory assistance and intubation and vomiting   Alternatives discussed:  Analgesia without sedation, anxiolysis and regional anesthesia Universal protocol:    Procedure explained and questions answered to patient or proxy's satisfaction: yes     Relevant documents present and verified: yes     Test results available: yes     Imaging studies available: yes     Required blood products, implants, devices, and special equipment available: yes     Site/side marked: yes     Immediately prior to procedure, a time out  was called: yes     Patient identity confirmed:  Verbally with patient Indications:    Procedure necessitating sedation performed by:  Physician performing sedation Pre-sedation assessment:    Time since last food or drink:  4   ASA classification: class 1 - normal, healthy patient     Mouth opening:  3 or more finger widths   Thyromental distance:  4 finger widths   Mallampati score:  I - soft palate, uvula, fauces, pillars visible   Neck mobility: normal     Pre-sedation assessments completed and reviewed: airway patency, cardiovascular function, hydration status, mental status, nausea/vomiting, pain level, respiratory function and temperature   A pre-sedation assessment was completed prior to the start of the procedure Immediate pre-procedure details:    Reassessment: Patient reassessed immediately prior to procedure     Reviewed: vital signs, relevant labs/tests and NPO status     Verified: bag valve mask available, emergency equipment available, intubation equipment  available, IV patency confirmed, oxygen available and suction available   Procedure details (see MAR for exact dosages):    Preoxygenation:  Nasal cannula   Sedation:  Propofol    Intended level of sedation: deep   Intra-procedure monitoring:  Blood pressure monitoring, cardiac monitor, continuous pulse oximetry, frequent LOC assessments, frequent vital sign checks and continuous capnometry   Intra-procedure events: none     Total Provider sedation time (minutes):  10 Post-procedure details:   A post-sedation assessment was completed following the completion of the procedure.   Attendance: Constant attendance by certified staff until patient recovered     Recovery: Patient returned to pre-procedure baseline     Post-sedation assessments completed and reviewed: airway patency, cardiovascular function, hydration status, mental status, nausea/vomiting, pain level, respiratory function and temperature     Patient is stable for discharge or admission: yes     Procedure completion:  Tolerated well, no immediate complications .Reduction of fracture  Date/Time: 10/22/2024 3:30 PM  Performed by: Jerrol Agent, MD Authorized by: Jerrol Agent, MD  Consent: Verbal consent obtained. Written consent obtained Risks and benefits: risks, benefits and alternatives were discussed Consent given by: patient Required items: required blood products, implants, devices, and special equipment available Patient identity confirmed: arm band and verbally with patient Time out: Immediately prior to procedure a time out was called to verify the correct patient, procedure, equipment, support staff and site/side marked as required. Preparation: Patient was prepped and draped in the usual sterile fashion. Local anesthesia used: no  Anesthesia: Local anesthesia used: no  Sedation: Patient sedated: yes  Patient tolerance: patient tolerated the procedure well with no immediate complications      Medications  Ordered in the ED  propofol  (DIPRIVAN ) 10 mg/mL bolus/IV push 81.6 mg (has no administration in time range)  acetaminophen  (TYLENOL ) tablet 650 mg (has no administration in time range)    Or  acetaminophen  (TYLENOL ) suppository 650 mg (has no administration in time range)  oxyCODONE  (Oxy IR/ROXICODONE ) immediate release tablet 5 mg (has no administration in time range)  HYDROmorphone  (DILAUDID ) injection 0.5-1 mg (has no administration in time range)  polyethylene glycol (MIRALAX  / GLYCOLAX ) packet 17 g (has no administration in time range)  senna (SENOKOT) tablet 8.6 mg (has no administration in time range)  HYDROcodone -acetaminophen  (NORCO/VICODIN) 5-325 MG per tablet 1 tablet (1 tablet Oral Given 10/22/24 1439)  sodium chloride  0.9 % bolus 1,000 mL (0 mLs Intravenous Stopped 10/22/24 1714)  ondansetron  (ZOFRAN ) injection 4 mg (4 mg Intravenous Given 10/22/24 1502)  fentaNYL  (  SUBLIMAZE ) injection 50 mcg (50 mcg Intravenous Given 10/22/24 1545)  propofol  (DIPRIVAN ) 10 mg/mL bolus/IV push (20 mg Intravenous Given 10/22/24 1553)  fentaNYL  (SUBLIMAZE ) injection 50 mcg (50 mcg Intravenous Given 10/22/24 1623)                                    Medical Decision Making Amount and/or Complexity of Data Reviewed Labs: ordered. Radiology: ordered.  Risk Prescription drug management. Decision regarding hospitalization.    68 year old female presenting to the emergency department with concern for right ankle fracture dislocation.  The patient states that she fell twisting her ankle in the yard sustaining immediate ankle and leg pain.  She sustained a deformity to the right ankle.  She had palpable pulses along the dorsalis pedis with EMS on the right.  She denies head trauma, denies loss of consciousness, denies any other injuries or complaints.  Was administered 75 mcg of fentanyl  and 4 mg of Zofran  by EMS and route.  On arrival, the patient was afebrile, overall vitally stable.  Presenting  with concern for fracture dislocation of the right ankle, intact pulses on arrival, no other injuries identified on primary or secondary survey.  X-ray right ankle: Fracture dislocation noted on my review, full read revealed FINDINGS:    BONES AND JOINTS:  Significantly displaced, trimalleolar fracture or dislocation of the ankle.  Obliquely oriented fracture through the distal fibula, which is posteriorly and  laterally displaced 1.1 cm laterally and 1.5 cm posteriorly. The medial  malleolus is distracted 1.3 cm from the distal tibia. Approximately 2.5 cm of  lateral dislocation of the talus with respect to the tibia. The talus is also  displaced 2.5 cm posteriorly. Likely posterior malleolar fracture also present,  but not well appreciated.    SOFT TISSUES:  Moderate soft tissue swelling about the ankle and calf.    IMPRESSION:  1. Significantly displaced trimalleolar fracture-dislocation of the ankle, as  described above.     Orthopedics consulted, spoke with Ozell Kenner regarding the patient for emergent reduction which was performed bedside under procedural sedation as per the procedure note above.  Following reduction, postreduction films were performed. IMPRESSION:  1. Reduction of the trimalleolar fracture dislocation seen  previously. Severe dorsal subluxation of the tibiotalar joint  without frank dislocation.   Given the persistent subluxation of the tibiotalar joint, orthopedics recommended admission for further management, will likely require surgery tomorrow.  Medicine consulted for admission, Dr. Donah accepting.     Final diagnoses:  Closed fracture of right ankle, initial encounter    ED Discharge Orders     None          Jerrol Agent, MD 10/22/24 1743    Jerrol Agent, MD 11/05/24 [phone]

## 2024-10-22 NOTE — H&P (Cosign Needed Addendum)
 Hospital Admission History and Physical Service Pager: [phone]  Patient name: Elizabeth Yoder Medical record number: [id number] Date of Birth: Jan 27, 1956 Age: 68 y.o. Gender: female  Primary Care Provider: Patient, No Pcp Per  Consultants: Ortho Code Status: Full code which was confirmed with family if patient unable to confirm   Preferred Emergency Contact:  Contact Information   None on File    Other Contacts     Name Relation Home Work Mobile   [place] Son   [phone]        Chief Complaint: Ankle pain  Differential and Medical Decision Making:  Elizabeth Yoder is a 68 y.o. female presenting with Right ankle fracture secondary to mechanical fall. PMHx significant for RA. Ortho following with plans for surgery tomorrow at 0900.  Assessment & Plan Ankle fracture - Admit to FMTS, attending Dr. Donah  - MedSurg, Vital signs per floor - Consults: Ortho - Pain control: Tylenol  650 mg q6h, oxycodone  5 mg q4h prn, dilaudud 0.5-1 mg q2h prn - Bowel Reg: Miralax , Senna  - AM Labs: BMP, CBC - Fall precautions - PT/OT consult s/p surgery   Elevated serum creatinine Baseline <1. On admission 1.04 Recheck BMP tomorrow  Rheumatoid arthritis (HCC) Home Meds: Methotrexate 15 mg weekly, Plaquenil 300 mg daily  Holding home meds, restart s/p surgery    FEN/GI: Regular diet, NPO and MN VTE Prophylaxis: SCDs, VTE prophylaxis following surgery   Disposition: MedSurg  History of Present Illness:  Elizabeth Yoder is a 68 y.o. female presenting with ankle fracture after mechanical fall. Patient reports being outside and slipping on the leaves in her yard. She is in significant pain currently. No prior fractures. Denies LOC, palpations, chest pain at time of injury.   In the ED, ankle x-ray significant for right ankle dislocation. Ortho relocated her ankle in the ED and placed splint. Repeat x-ray showed fracture and joint subluxation. Ortho planning for surgery tomorrow  and paged FMTS for admission for pain control.   Review Of Systems: Per HPI with the following additions: as above   Pertinent Past Medical History: RA Remainder reviewed in history tab.   Pertinent Past Surgical History: Tubal ligation   Remainder reviewed in history tab.   Pertinent Social History: Tobacco use: denies  Alcohol use: denies Other Substance use: denies Lives with roommate   Pertinent Family History: Non-contributing   Important Outpatient Medications: Albuterol  Inhaler PRN Famotidine 20 mg daily  Folic acid Plaquenil 300 mg daily  Methotrexate 2.5 mg 6 tablets weekly  Naproxen  500 mg   Objective: BP 136/70   Pulse 80   Temp (!) 97.5 F (36.4 C) (Oral)   Resp 12   Ht 5' 3 (1.6 m)   Wt 81.6 kg   SpO2 97%   BMI 31.89 kg/m  Exam: General: Well-appearing, no acute distress Cardio: RRR, no murmur on exam Pulm: clear, No increased work of breathing Abdomen: Soft, bowel sounds present, nontender Extremity: Right leg in soft splint and elevated, toes warm and pedal pulse +2  Labs:  CBC BMET  Recent Labs  Lab 10/22/24 1503  WBC 6.7  HGB 13.1  HCT 40.6  PLT 166   Recent Labs  Lab 10/22/24 1503  NA 139  K 4.1  CL 103  CO2 27  BUN 20  CREATININE 1.04*  GLUCOSE 118*  CALCIUM  8.7*    \ EKG: not done, pending    Imaging Studies Performed:  DG Ankle Right:  Significantly displaced trimalleolar  fracture-dislocation.   DG Ankle Right:  Reduction of trimalleolar fracture dislocation with severe dorsal subluxation of the tibiotalar joint without frank dislocation   CT Right Ankle Pending    Cleotilde Perkins, DO 10/22/2024, 5:50 PM PGY-3, [facility]  FPTS Intern pager: [phone], text pages welcome Secure chat group [facility] Teaching Service

## 2024-10-22 NOTE — ED Notes (Signed)
 RLE sensation intact and cap refill <3 seconds, PT able to move digits.

## 2024-10-22 NOTE — Consult Note (Signed)
 Reason for Consult:Right ankle fx Referring Physician: Lynwood Yoder Time called: 1437 Time at bedside: 1439   Elizabeth Yoder is an 68 y.o. female.  HPI: Elizabeth Yoder was outside and slipped on some leaves. She had immediate right ankle pain and could not get up or bear weight. She was brought to the ED where x-rays showed an ankle fx and orthopedic surgery was consulted. She lives at home with her son and DIL and works as a architectural technologist.  Past Medical History:  Diagnosis Date   Arthritis     Past Surgical History:  Procedure Laterality Date   TUBAL LIGATION      Family History  Problem Relation Age of Onset   Hypertension Mother    Heart disease Mother 4       mild AMI/no stenting   Cancer Father        unknown cancer? stomach? tobacco   Heart disease Father 68       AMI as cause of death   Hypertension Sister    Hyperlipidemia Sister     Social History:  reports that she has never smoked. She has never used smokeless tobacco. She reports that she does not drink alcohol and does not use drugs.  Allergies: No Known Allergies  Medications: I have reviewed the patient's current medications.  No results found for this or any previous visit (from the past 48 hours).  No results found.  Review of Systems  HENT:  Negative for ear discharge, ear pain, hearing loss and tinnitus.   Eyes:  Negative for photophobia and pain.  Respiratory:  Negative for cough and shortness of breath.   Cardiovascular:  Negative for chest pain.  Gastrointestinal:  Negative for abdominal pain, nausea and vomiting.  Genitourinary:  Negative for dysuria, flank pain, frequency and urgency.  Musculoskeletal:  Positive for arthralgias (Right ankle). Negative for back pain, myalgias and neck pain.  Neurological:  Negative for dizziness and headaches.  Hematological:  Does not bruise/bleed easily.  Psychiatric/Behavioral:  The patient is not nervous/anxious.    Blood pressure (!) 140/86, pulse 70,  temperature 98 F (36.7 C), temperature source Oral, resp. rate 18, height 5' 3 (1.6 m), weight 81.6 kg, SpO2 98%. Physical Exam Constitutional:      General: She is not in acute distress.    Appearance: She is well-developed. She is not diaphoretic.  HENT:     Head: Normocephalic and atraumatic.  Eyes:     General: No scleral icterus.       Right eye: No discharge.        Left eye: No discharge.     Conjunctiva/sclera: Conjunctivae normal.  Cardiovascular:     Rate and Rhythm: Normal rate and regular rhythm.  Pulmonary:     Effort: Pulmonary effort is normal. No respiratory distress.  Musculoskeletal:     Cervical back: Normal range of motion.     Comments: RLE No traumatic wounds, ecchymosis, or rash  Mod TTP ankle w/deformity  No knee effusion  Knee stable to varus/ valgus and anterior/posterior stress  Sens DPN, SPN, TN intact  Motor EHL 5/5  DP 2+, No significant edema  Skin:    General: Skin is warm and dry.  Neurological:     Mental Status: She is alert.  Psychiatric:        Mood and Affect: Mood normal.        Behavior: Behavior normal.     Assessment/Plan: Right ankle fx -- Plan CR via CS  by EDP. This was unsuccessful. Will plan to admit to medicine and take to OR tomorrow with Dr. Elsa for ex fix vs ORIF.    Elizabeth DOROTHA Ned, PA-C Orthopedic Surgery [phone] 10/22/2024, 2:53 PM

## 2024-10-22 NOTE — Assessment & Plan Note (Signed)
 Home Meds: Methotrexate 15 mg weekly, Plaquenil 300 mg daily  Holding home meds, restart s/p surgery

## 2024-10-22 NOTE — Procedures (Signed)
Procedure: Right ankle closed reduction   Indication: Right ankle fracture   Surgeon: Silvestre Gunner, PA-C   Assist: None   Anesthesia: Propofol via EDP   EBL: None   Complications: None   Findings: After risks/benefits explained patient desires to undergo procedure. Consent obtained and time out performed. Sedation given and efficacy confirmed. Ankle reduced and splinted. Pt tolerated the procedure well.       Lisette Abu, PA-C Orthopedic Surgery [phone]

## 2024-10-22 NOTE — Assessment & Plan Note (Signed)
 Baseline <1. On admission 1.04 Recheck BMP tomorrow

## 2024-10-22 NOTE — Anesthesia Preprocedure Evaluation (Signed)
 Anesthesia Evaluation  Patient identified by MRN, date of birth, ID band Patient awake    Reviewed: Allergy & Precautions, NPO status , Patient's Chart, lab work & pertinent test results  Airway Mallampati: II  TM Distance: >3 FB Neck ROM: Full  Mouth opening: Limited Mouth Opening  Dental no notable dental hx.    Pulmonary neg pulmonary ROS   Pulmonary exam normal breath sounds clear to auscultation       Cardiovascular negative cardio ROS Normal cardiovascular exam Rhythm:Regular Rate:Normal     Neuro/Psych negative neurological ROS     GI/Hepatic ,GERD  Controlled and Medicated,,(+) Hepatitis -, Unspecified  Endo/Other  negative endocrine ROS    Renal/GU Renal disease     Musculoskeletal  (+) Arthritis ,    Abdominal   Peds  Hematology negative hematology ROS (+)   Anesthesia Other Findings   Reproductive/Obstetrics                              Anesthesia Physical Anesthesia Plan  ASA: 2  Anesthesia Plan: General   Post-op Pain Management: Tylenol  PO (pre-op)*   Induction: Intravenous  PONV Risk Score and Plan: 4 or greater and Ondansetron , Dexamethasone  and Treatment may vary due to age or medical condition  Airway Management Planned: Oral ETT and Video Laryngoscope Planned  Additional Equipment:   Intra-op Plan:   Post-operative Plan: Extubation in OR  Informed Consent: I have reviewed the patients History and Physical, chart, labs and discussed the procedure including the risks, benefits and alternatives for the proposed anesthesia with the patient or authorized representative who has indicated his/her understanding and acceptance.     Dental advisory given  Plan Discussed with: CRNA  Anesthesia Plan Comments:          Anesthesia Quick Evaluation

## 2024-10-23 ENCOUNTER — Observation Stay (HOSPITAL_COMMUNITY)

## 2024-10-23 ENCOUNTER — Observation Stay (HOSPITAL_COMMUNITY): Payer: Self-pay | Admitting: Anesthesiology

## 2024-10-23 ENCOUNTER — Encounter (HOSPITAL_COMMUNITY): Payer: Self-pay | Admitting: Student

## 2024-10-23 ENCOUNTER — Observation Stay (HOSPITAL_BASED_OUTPATIENT_CLINIC_OR_DEPARTMENT_OTHER): Payer: Self-pay | Admitting: Anesthesiology

## 2024-10-23 ENCOUNTER — Encounter (HOSPITAL_COMMUNITY)
Admission: EM | Disposition: A | Discharge status: Home / Self Care | Payer: Self-pay | Source: Home / Self Care | Attending: Emergency Medicine

## 2024-10-23 DIAGNOSIS — G4700 Insomnia, unspecified: Secondary | ICD-10-CM | POA: Insufficient documentation

## 2024-10-23 DIAGNOSIS — M0600 Rheumatoid arthritis without rheumatoid factor, unspecified site: Secondary | ICD-10-CM

## 2024-10-23 DIAGNOSIS — S82891A Other fracture of right lower leg, initial encounter for closed fracture: Secondary | ICD-10-CM | POA: Diagnosis not present

## 2024-10-23 DIAGNOSIS — Z789 Other specified health status: Secondary | ICD-10-CM

## 2024-10-23 HISTORY — PX: ORIF ANKLE FRACTURE: SHX5408

## 2024-10-23 LAB — CBC
HCT: 38.8 % (ref 36.0–46.0)
Hemoglobin: 12.7 g/dL (ref 12.0–15.0)
MCH: 30.7 pg (ref 26.0–34.0)
MCHC: 32.7 g/dL (ref 30.0–36.0)
MCV: 93.7 fL (ref 80.0–100.0)
Platelets: 174 K/uL (ref 150–400)
RBC: 4.14 MIL/uL (ref 3.87–5.11)
RDW: 14.7 % (ref 11.5–15.5)
WBC: 5.6 K/uL (ref 4.0–10.5)
nRBC: 0 % (ref 0.0–0.2)

## 2024-10-23 LAB — BASIC METABOLIC PANEL WITH GFR
Anion gap: 8 (ref 5–15)
BUN: 16 mg/dL (ref 8–23)
CO2: 27 mmol/L (ref 22–32)
Calcium: 8.7 mg/dL — ABNORMAL LOW (ref 8.9–10.3)
Chloride: 103 mmol/L (ref 98–111)
Creatinine, Ser: 1 mg/dL (ref 0.44–1.00)
GFR, Estimated: >60 mL/min (ref >60)
Glucose, Bld: 86 mg/dL (ref 70–99)
Potassium: 4.2 mmol/L (ref 3.5–5.1)
Sodium: 138 mmol/L (ref 135–145)

## 2024-10-23 LAB — SURGICAL PCR SCREEN
MRSA, PCR: NEGATIVE
Staphylococcus aureus: NEGATIVE

## 2024-10-23 LAB — HIV ANTIBODY (ROUTINE TESTING W REFLEX): HIV Screen 4th Generation wRfx: NONREACTIVE

## 2024-10-23 SURGERY — OPEN REDUCTION INTERNAL FIXATION (ORIF) ANKLE FRACTURE
Anesthesia: General | Site: Ankle | Laterality: Right

## 2024-10-23 MED ORDER — CHLORHEXIDINE GLUCONATE 0.12 % MT SOLN
OROMUCOSAL | Status: AC
  Administered 2024-10-23: 15 mL via OROMUCOSAL
  Filled 2024-10-23: qty 15

## 2024-10-23 MED ORDER — FENTANYL CITRATE (PF) 100 MCG/2ML IJ SOLN: 50.0000 ug | Freq: Once | INTRAMUSCULAR | Status: AC

## 2024-10-23 MED ORDER — ONDANSETRON HCL 4 MG/2ML IJ SOLN
INTRAMUSCULAR | Status: DC | PRN
  Administered 2024-10-23: 4 mg via INTRAVENOUS

## 2024-10-23 MED ORDER — LACTATED RINGERS IV SOLN
INTRAVENOUS | Status: DC
Start: 2024-10-23 — End: 2024-10-23

## 2024-10-23 MED ORDER — DROPERIDOL 2.5 MG/ML IJ SOLN: 0.6250 mg | Freq: Once | INTRAMUSCULAR | Status: DC | PRN

## 2024-10-23 MED ORDER — LIDOCAINE 2% (20 MG/ML) 5 ML SYRINGE
INTRAMUSCULAR | Status: AC
  Filled 2024-10-23: qty 5

## 2024-10-23 MED ORDER — FENTANYL CITRATE (PF) 100 MCG/2ML IJ SOLN
INTRAMUSCULAR | Status: AC
  Administered 2024-10-23: 50 ug via INTRAVENOUS
  Filled 2024-10-23: qty 2

## 2024-10-23 MED ORDER — ONDANSETRON HCL 4 MG/2ML IJ SOLN
INTRAMUSCULAR | Status: AC
  Filled 2024-10-23: qty 2

## 2024-10-23 MED ORDER — PHENYLEPHRINE 80 MCG/ML (10ML) SYRINGE FOR IV PUSH (FOR BLOOD PRESSURE SUPPORT)
PREFILLED_SYRINGE | INTRAVENOUS | Status: AC
  Filled 2024-10-23: qty 10

## 2024-10-23 MED ORDER — FENTANYL CITRATE (PF) 100 MCG/2ML IJ SOLN
INTRAMUSCULAR | Status: AC
  Filled 2024-10-23: qty 2

## 2024-10-23 MED ORDER — CHLORHEXIDINE GLUCONATE 4 % EX SOLN
60.0000 mL | Freq: Once | CUTANEOUS | Status: DC
  Filled 2024-10-23: qty 60

## 2024-10-23 MED ORDER — ROCURONIUM BROMIDE 10 MG/ML (PF) SYRINGE
PREFILLED_SYRINGE | INTRAVENOUS | Status: DC | PRN
  Administered 2024-10-23: 50 mg via INTRAVENOUS
  Administered 2024-10-23: 10 mg via INTRAVENOUS

## 2024-10-23 MED ORDER — CLONIDINE HCL (ANALGESIA) 100 MCG/ML EP SOLN
EPIDURAL | Status: DC | PRN
  Administered 2024-10-23: 50 ug
  Administered 2024-10-23: 30 ug

## 2024-10-23 MED ORDER — ACETAMINOPHEN 500 MG PO TABS
1000.0000 mg | ORAL_TABLET | Freq: Once | ORAL | Status: AC
  Administered 2024-10-23: 1000 mg via ORAL
  Filled 2024-10-23: qty 2

## 2024-10-23 MED ORDER — MIDAZOLAM HCL (PF) 2 MG/2ML IJ SOLN: 1.0000 mg | Freq: Once | INTRAMUSCULAR | Status: AC

## 2024-10-23 MED ORDER — PHENYLEPHRINE 80 MCG/ML (10ML) SYRINGE FOR IV PUSH (FOR BLOOD PRESSURE SUPPORT)
PREFILLED_SYRINGE | INTRAVENOUS | Status: DC | PRN
Start: 2024-10-23 — End: 2024-10-23
  Administered 2024-10-23: 80 ug via INTRAVENOUS
  Administered 2024-10-23: 160 ug via INTRAVENOUS

## 2024-10-23 MED ORDER — PROPOFOL 10 MG/ML IV BOLUS
INTRAVENOUS | Status: AC
  Filled 2024-10-23: qty 20

## 2024-10-23 MED ORDER — POVIDONE-IODINE 10 % EX SWAB
2.0000 | Freq: Once | CUTANEOUS | Status: AC
  Administered 2024-10-23: 2 via TOPICAL

## 2024-10-23 MED ORDER — DEXAMETHASONE SODIUM PHOSPHATE 4 MG/ML IJ SOLN
INTRAMUSCULAR | Status: DC | PRN
  Administered 2024-10-23: 2 mg via PERINEURAL
  Administered 2024-10-23: 3 mg via PERINEURAL
  Administered 2024-10-23: 5 mg via PERINEURAL

## 2024-10-23 MED ORDER — FENTANYL CITRATE (PF) 250 MCG/5ML IJ SOLN
INTRAMUSCULAR | Status: DC | PRN
Start: 2024-10-23 — End: 2024-10-23
  Administered 2024-10-23: 50 ug via INTRAVENOUS

## 2024-10-23 MED ORDER — HYDROXYZINE HCL 10 MG PO TABS: 10.0000 mg | ORAL_TABLET | Freq: Once | ORAL | Status: DC | PRN

## 2024-10-23 MED ORDER — CEFAZOLIN SODIUM-DEXTROSE 2-4 GM/100ML-% IV SOLN
2.0000 g | Freq: Four times a day (QID) | INTRAVENOUS | Status: AC
  Administered 2024-10-23 – 2024-10-24 (×2): 2 g via INTRAVENOUS
  Filled 2024-10-23 (×2): qty 100

## 2024-10-23 MED ORDER — SUGAMMADEX SODIUM 200 MG/2ML IV SOLN
INTRAVENOUS | Status: DC | PRN
  Administered 2024-10-23: 163.2 mg via INTRAVENOUS

## 2024-10-23 MED ORDER — EPHEDRINE SULFATE-NACL 50-0.9 MG/10ML-% IV SOSY
PREFILLED_SYRINGE | INTRAVENOUS | Status: DC | PRN
  Administered 2024-10-23 (×2): 10 mg via INTRAVENOUS

## 2024-10-23 MED ORDER — MIDAZOLAM HCL (PF) 2 MG/2ML IJ SOLN
INTRAMUSCULAR | Status: DC | PRN
  Administered 2024-10-23: 1 mg via INTRAVENOUS

## 2024-10-23 MED ORDER — PROPOFOL 10 MG/ML IV BOLUS
INTRAVENOUS | Status: DC | PRN
  Administered 2024-10-23: 130 mg via INTRAVENOUS

## 2024-10-23 MED ORDER — 0.9 % SODIUM CHLORIDE (POUR BTL) OPTIME
TOPICAL | Status: DC | PRN
  Administered 2024-10-23: 1000 mL

## 2024-10-23 MED ORDER — MIDAZOLAM HCL 2 MG/2ML IJ SOLN
INTRAMUSCULAR | Status: AC
  Filled 2024-10-23: qty 2

## 2024-10-23 MED ORDER — CHLORHEXIDINE GLUCONATE 0.12 % MT SOLN: 15.0000 mL | Freq: Once | OROMUCOSAL | Status: AC

## 2024-10-23 MED ORDER — LIDOCAINE 2% (20 MG/ML) 5 ML SYRINGE
INTRAMUSCULAR | Status: DC | PRN
  Administered 2024-10-23: 60 mg via INTRAVENOUS

## 2024-10-23 MED ORDER — BUPIVACAINE HCL (PF) 0.5 % IJ SOLN
INTRAMUSCULAR | Status: DC | PRN
  Administered 2024-10-23: 25 mL via PERINEURAL
  Administered 2024-10-23: 15 mL via PERINEURAL

## 2024-10-23 MED ORDER — MIDAZOLAM HCL 2 MG/2ML IJ SOLN
INTRAMUSCULAR | Status: AC
  Administered 2024-10-23: 1 mg via INTRAVENOUS
  Filled 2024-10-23: qty 2

## 2024-10-23 MED ORDER — HYDROMORPHONE HCL 1 MG/ML IJ SOLN: 0.2500 mg | INTRAMUSCULAR | Status: DC | PRN

## 2024-10-23 MED ORDER — CEFAZOLIN SODIUM-DEXTROSE 2-4 GM/100ML-% IV SOLN
2.0000 g | INTRAVENOUS | Status: AC
  Administered 2024-10-23: 2 g via INTRAVENOUS
  Filled 2024-10-23: qty 100

## 2024-10-23 MED ORDER — ORAL CARE MOUTH RINSE: 15.0000 mL | Freq: Once | OROMUCOSAL | Status: AC

## 2024-10-23 MED ORDER — OXYCODONE HCL 5 MG PO TABS
10.0000 mg | ORAL_TABLET | ORAL | Status: DC | PRN
  Filled 2024-10-23: qty 2

## 2024-10-23 MED ORDER — EPHEDRINE 5 MG/ML INJ
INTRAVENOUS | Status: AC
  Filled 2024-10-23: qty 5

## 2024-10-23 SURGICAL SUPPLY — 71 items
BIT DRILL 2 CANN GRADUATED (BIT) IMPLANT
BIT DRILL 2.5 CANN STRL (BIT) IMPLANT
BIT DRILL 2.7X2.7/3XSCR ANKL (BIT) IMPLANT
BIT DRILL 3 CANN ENDOSCOPIC (BIT) IMPLANT
BLADE SURG 15 STRL LF DISP TIS (BLADE) ×3 IMPLANT
BNDG COHESIVE 4X5 TAN STRL LF (GAUZE/BANDAGES/DRESSINGS) IMPLANT
BNDG COHESIVE 6X5 TAN ST LF (GAUZE/BANDAGES/DRESSINGS) ×3 IMPLANT
BNDG COMPR ESMARK 6X3 LF (GAUZE/BANDAGES/DRESSINGS) IMPLANT
BNDG ELASTIC 4X5.8 VLCR STR LF (GAUZE/BANDAGES/DRESSINGS) ×3 IMPLANT
BNDG ELASTIC 6INX 5YD STR LF (GAUZE/BANDAGES/DRESSINGS) ×3 IMPLANT
BNDG ELASTIC 6X10 VLCR STRL LF (GAUZE/BANDAGES/DRESSINGS) ×3 IMPLANT
BNDG GAUZE DERMACEA FLUFF 4 (GAUZE/BANDAGES/DRESSINGS) ×6 IMPLANT
CANISTER SUCTION 3000ML PPV (SUCTIONS) ×3 IMPLANT
CHLORAPREP W/TINT 26 (MISCELLANEOUS) ×6 IMPLANT
COVER SURGICAL LIGHT HANDLE (MISCELLANEOUS) ×3 IMPLANT
CUFF TOURN SGL QUICK 42 (TOURNIQUET CUFF) IMPLANT
CUFF TRNQT CYL 34X4.125X (TOURNIQUET CUFF) ×3 IMPLANT
DRAPE C-ARM 42X72 X-RAY (DRAPES) IMPLANT
DRAPE C-ARMOR (DRAPES) ×3 IMPLANT
DRAPE U-SHAPE 47X51 STRL (DRAPES) ×3 IMPLANT
DRSG MEPITEL 4X7.2 (GAUZE/BANDAGES/DRESSINGS) ×3 IMPLANT
DRSG XEROFORM 1X8 (GAUZE/BANDAGES/DRESSINGS) ×3 IMPLANT
ELECTRODE REM PT RTRN 9FT ADLT (ELECTROSURGICAL) ×3 IMPLANT
GAUZE PAD ABD 8X10 STRL (GAUZE/BANDAGES/DRESSINGS) ×6 IMPLANT
GAUZE SPONGE 4X4 12PLY STRL (GAUZE/BANDAGES/DRESSINGS) ×3 IMPLANT
GAUZE SPONGE 4X4 12PLY STRL LF (GAUZE/BANDAGES/DRESSINGS) ×3 IMPLANT
GAUZE XEROFORM 1X8 LF (GAUZE/BANDAGES/DRESSINGS) IMPLANT
GAUZE XEROFORM 5X9 LF (GAUZE/BANDAGES/DRESSINGS) ×3 IMPLANT
GLOVE BIO SURGEON STRL SZ7.5 (GLOVE) ×6 IMPLANT
GLOVE BIOGEL M STRL SZ7.5 (GLOVE) ×3 IMPLANT
GLOVE BIOGEL PI IND STRL 8 (GLOVE) ×6 IMPLANT
GLOVE SRG 8 PF TXTR STRL LF DI (GLOVE) ×3 IMPLANT
GLOVE SURG ENC TEXT LTX SZ7.5 (GLOVE) ×3 IMPLANT
GOWN STRL REUS W/ TWL LRG LVL3 (GOWN DISPOSABLE) ×3 IMPLANT
GOWN STRL REUS W/ TWL XL LVL3 (GOWN DISPOSABLE) ×9 IMPLANT
KIT BASIN OR (CUSTOM PROCEDURE TRAY) ×3 IMPLANT
KIT TURNOVER KIT B (KITS) ×3 IMPLANT
NDL 22X1.5 STRL (OR ONLY) (MISCELLANEOUS) IMPLANT
PACK ORTHO EXTREMITY (CUSTOM PROCEDURE TRAY) ×3 IMPLANT
PAD ARMBOARD POSITIONER FOAM (MISCELLANEOUS) ×6 IMPLANT
PAD CAST 4YDX4 CTTN HI CHSV (CAST SUPPLIES) ×3 IMPLANT
PADDING CAST COTTON 6X4 STRL (CAST SUPPLIES) ×9 IMPLANT
PADDING CAST SYNTHETIC 4X4 STR (CAST SUPPLIES) IMPLANT
PENCIL BUTTON HOLSTER BLD 10FT (ELECTRODE) IMPLANT
PLATE LOCK DIST FIB 5H RT (Plate) IMPLANT
SCREW BN T10 FT 20X2.7XST CORT (Screw) IMPLANT
SCREW COMP KREULOCK 2.7X16 (Screw) IMPLANT
SCREW COMP KREULOCK 2.7X18 (Screw) IMPLANT
SCREW LO PRF TMSS 3.5X55 CORT (Screw) IMPLANT
SCREW LOW PROFILE 3.5X14 (Screw) IMPLANT
SCREW LOW PROFILE 3.5X16 (Screw) IMPLANT
SCREW LOW PROFILE 4.0X40 (Screw) IMPLANT
SET HNDPC FAN SPRY TIP SCT (DISPOSABLE) IMPLANT
SOLN 0.9% NACL POUR BTL 1000ML (IV SOLUTION) ×3 IMPLANT
SOLN STERILE WATER BTL 1000 ML (IV SOLUTION) ×6 IMPLANT
SPLINT PLASTER CAST XFAST 5X30 (CAST SUPPLIES) IMPLANT
SPONGE T-LAP 18X18 ~~LOC~~+RFID (SPONGE) ×3 IMPLANT
STAPLER SKIN PROX 35W (STAPLE) IMPLANT
STOCKINETTE IMPERVIOUS LG (DRAPES) ×3 IMPLANT
STOCKINETTE TUBULAR SYNTH 4IN (CAST SUPPLIES) IMPLANT
SUCTION TUBE FRAZIER 10FR DISP (SUCTIONS) ×3 IMPLANT
SUT ETHILON 3 0 PS 1 (SUTURE) ×3 IMPLANT
SUT MNCRL AB 3-0 PS2 18 (SUTURE) IMPLANT
SUT MNCRL AB 3-0 PS2 27 (SUTURE) ×3 IMPLANT
SUT VIC AB 2-0 CT1 TAPERPNT 27 (SUTURE) ×6 IMPLANT
SYR CONTROL 10ML LL (SYRINGE) IMPLANT
TOWEL GREEN STERILE (TOWEL DISPOSABLE) ×6 IMPLANT
TOWEL GREEN STERILE FF (TOWEL DISPOSABLE) ×6 IMPLANT
TUBE CONNECTING 12X1/4 (SUCTIONS) ×3 IMPLANT
UNDERPAD 30X36 HEAVY ABSORB (UNDERPADS AND DIAPERS) ×3 IMPLANT
YANKAUER SUCT BULB TIP NO VENT (SUCTIONS) ×3 IMPLANT

## 2024-10-23 NOTE — Assessment & Plan Note (Signed)
 S/p Fall x-ray shows Significantly displaced trimalleolar fracture-dislocation of the ankle  - Vital signs per floor - Ortho surgery consults : Plan for OR today - Pain control: Tylenol  650 mg q6h, oxycodone  5 mg q4h prn, dilaudud 0.5-1 mg q2h prn - Bowel Reg: Miralax , Senna  - AM Labs: BMP, CBC - Fall precautions - PT/OT consult s/p surgery

## 2024-10-23 NOTE — Consult Note (Signed)
 Reason for Consult: Right trimalleolar ankle fracture dislocation Referring Physician: ED  Elizabeth Yoder is an 68 y.o. female.  HPI: Patient had a fall at home on some slippery leaves.  She had pain and deformity to her ankle.  She was brought to the emergency department where x-rays revealed an ankle fracture dislocation.  Attempted closed reduction was performed in the emergency department but there was persistent subluxation due to the instability of the fracture.  She was admitted to the hospitalist team for urgent surgery.  She has pain in her ankle.  She has been maintaining nonweightbearing.  Her son is at bedside.  Past Medical History:  Diagnosis Date   Arthritis    Rheumatoid arthritis (HCC)     Past Surgical History:  Procedure Laterality Date   CHOLECYSTECTOMY     TUBAL LIGATION      Family History  Problem Relation Age of Onset   Hypertension Mother    Heart disease Mother 42       mild AMI/no stenting   Cancer Father        unknown cancer? stomach? tobacco   Heart disease Father 57       AMI as cause of death   Hypertension Sister    Hyperlipidemia Sister     Social History:  reports that she has never smoked. She has never used smokeless tobacco. She reports that she does not drink alcohol and does not use drugs.  Allergies:  Allergies  Allergen Reactions   Beef Allergy Other (See Comments)    Per patient's preference, does not eat beef.   Bovine (Beef) Protein-Containing Drug Products    Pork Allergy Other (See Comments)    Per patient's preference, does not eat pork.     Medications: I have reviewed the patient's current medications.  Results for orders placed or performed during the hospital encounter of 10/22/24 (from the past 48 hours)  CBC with Differential     Status: None   Collection Time: 10/22/24  3:03 PM  Result Value Ref Range   WBC 6.7 4.0 - 10.5 K/uL   RBC 4.22 3.87 - 5.11 MIL/uL   Hemoglobin 13.1 12.0 - 15.0 g/dL   HCT 59.3 63.9 -  53.9 %   MCV 96.2 80.0 - 100.0 fL   MCH 31.0 26.0 - 34.0 pg   MCHC 32.3 30.0 - 36.0 g/dL   RDW 85.6 88.4 - 84.4 %   Platelets 166 150 - 400 K/uL   nRBC 0.0 0.0 - 0.2 %   Neutrophils Relative % 76 %   Neutro Abs 5.1 1.7 - 7.7 K/uL   Lymphocytes Relative 13 %   Lymphs Abs 0.9 0.7 - 4.0 K/uL   Monocytes Relative 9 %   Monocytes Absolute 0.6 0.1 - 1.0 K/uL   Eosinophils Relative 2 %   Eosinophils Absolute 0.1 0.0 - 0.5 K/uL   Basophils Relative 0 %   Basophils Absolute 0.0 0.0 - 0.1 K/uL   Immature Granulocytes 0 %   Abs Immature Granulocytes 0.03 0.00 - 0.07 K/uL    Comment: Performed at [facility], [address], [place], [place] [address]  Basic metabolic panel     Status: Abnormal   Collection Time: 10/22/24  3:03 PM  Result Value Ref Range   Sodium 139 135 - 145 mmol/L   Potassium 4.1 3.5 - 5.1 mmol/L   Chloride 103 98 - 111 mmol/L   CO2 27 22 - 32 mmol/L   Glucose,  Bld 118 (H) 70 - 99 mg/dL    Comment: Glucose reference range applies only to samples taken after fasting for at least 8 hours.   BUN 20 8 - 23 mg/dL   Creatinine, Ser 8.95 (H) 0.44 - 1.00 mg/dL   Calcium  8.7 (L) 8.9 - 10.3 mg/dL   GFR, Estimated 59 (L) >60 mL/min    Comment: (NOTE) Calculated using the CKD-EPI Creatinine Equation (2021)    Anion gap 9 5 - 15    Comment: Performed at [facility], [address] [place], [place] [address]  HIV Antibody (routine testing w rflx)     Status: None   Collection Time: 10/23/24  5:17 AM  Result Value Ref Range   HIV Screen 4th Generation wRfx Non Reactive Non Reactive    Comment: Performed at [facility], [address], [place], [place] [address]  Basic metabolic panel     Status: Abnormal   Collection Time: 10/23/24  5:17 AM  Result Value Ref Range   Sodium 138 135 - 145 mmol/L   Potassium 4.2 3.5 - 5.1 mmol/L   Chloride 103 98 - 111 mmol/L   CO2 27 22 - 32 mmol/L   Glucose, Bld 86 70 - 99 mg/dL    Comment: Glucose reference range  applies only to samples taken after fasting for at least 8 hours.   BUN 16 8 - 23 mg/dL   Creatinine, Ser 8.99 0.44 - 1.00 mg/dL   Calcium  8.7 (L) 8.9 - 10.3 mg/dL   GFR, Estimated >39 >39 mL/min    Comment: (NOTE) Calculated using the CKD-EPI Creatinine Equation (2021)    Anion gap 8 5 - 15    Comment: Performed at [facility], [address], [place], [place] [address]  CBC     Status: None   Collection Time: 10/23/24  5:17 AM  Result Value Ref Range   WBC 5.6 4.0 - 10.5 K/uL   RBC 4.14 3.87 - 5.11 MIL/uL   Hemoglobin 12.7 12.0 - 15.0 g/dL   HCT 61.1 63.9 - 53.9 %   MCV 93.7 80.0 - 100.0 fL   MCH 30.7 26.0 - 34.0 pg   MCHC 32.7 30.0 - 36.0 g/dL   RDW 85.2 88.4 - 84.4 %   Platelets 174 150 - 400 K/uL   nRBC 0.0 0.0 - 0.2 %    Comment: Performed at [facility], [address], [place], [place] [address]    CT ANKLE RIGHT WO CONTRAST Result Date: 10/22/2024 EXAM: CT RIGHT ANKLE, WITHOUT IV CONTRAST 10/22/2024 05:39:32 PM TECHNIQUE: Axial images were acquired through the right ankle without IV contrast. Reformatted images were reviewed. Automated exposure control, iterative reconstruction, and/or weight based adjustment of the mA/kV was utilized to reduce the radiation dose to as low as reasonably achievable. COMPARISON: Radiographs 10/22/2024. CLINICAL HISTORY: Ankle fracture dislocation. FINDINGS: BONES: Stage IV Weber B fracture of the right ankle with oblique lateral malleolar component with mild comminution, oblique posterior malleolar component, and transverse medial malleolar component. Degenerative subcortical cysts along the proximal talus, tibial plafond, and medial malleolus. Plantar and Achilles calcaneal spurs. Type 2 accessory navicular. JOINTS: Tibiotalar malalignment with 1.6 cm anterior dislocation of the tibial shaft with respect to the talus. Prominent narrowing of the tibiotalar articular space indicating underlying chondral thinning. SOFT TISSUES: No  flexor tendon entrapment. Thick and medial band of the plantar fascia which could reflect plantar fasciitis. IMPRESSION: 1. Stage IV Weber B fracture  of the right ankle with oblique lateral malleolar component with mild comminution, oblique posterior malleolar component, and transverse medial malleolar component. 2. Tibiotalar malalignment with 1.6 cm anterior dislocation of the tibial shaft with respect to the talus. 3. Prominent narrowing of the tibiotalar articular space suggesting underlying chondral thinning. 4. Degenerative subcortical cysts along the proximal talus, tibial plafond, and medial malleolus. 5. Thickened medial band of the plantar fascia, which may reflect plantar fasciitis. Electronically signed by: Ryan Salvage MD 10/22/2024 05:54 PM EST RP Workstation: [id number]   DG Ankle Complete Right Result Date: 10/22/2024 CLINICAL DATA:  Fracture dislocation status post reduction EXAM: RIGHT ANKLE - COMPLETE 3+ VIEW COMPARISON:  10/22/2024 FINDINGS: Frontal, oblique, and lateral views of the right ankle are obtained. Cast material obscures underlying bony detail. There is been significant reduction of the medial and lateral malleolar fracture fragments since prior study, with near anatomic alignment. There is moderate residual distraction at the posterior malleolar fracture site, with separation of approximately 8 mm. There is severe dorsal subluxation of the talus relative to the tibial plafond, without frank dislocation. IMPRESSION: 1. Reduction of the trimalleolar fracture dislocation seen previously. Severe dorsal subluxation of the tibiotalar joint without frank dislocation. Electronically Signed   By: Ozell Daring M.D.   On: 10/22/2024 17:16   DG Ankle Complete Right Result Date: 10/22/2024 EXAM: 3 OR MORE VIEW(S) XRAY OF THE ANKLE 10/22/2024 02:53:00 PM CLINICAL HISTORY: fall, ankle fracture COMPARISON: None available. FINDINGS: BONES AND JOINTS: Significantly displaced,  trimalleolar fracture or dislocation of the ankle. Obliquely oriented fracture through the distal fibula, which is posteriorly and laterally displaced 1.1 cm laterally and 1.5 cm posteriorly. The medial malleolus is distracted 1.3 cm from the distal tibia. Approximately 2.5 cm of lateral dislocation of the talus with respect to the tibia. The talus is also displaced 2.5 cm posteriorly. Likely posterior malleolar fracture also present, but not well appreciated. SOFT TISSUES: Moderate soft tissue swelling about the ankle and calf. IMPRESSION: 1. Significantly displaced trimalleolar fracture-dislocation of the ankle, as described above. Electronically signed by: Rogelia Myers MD 10/22/2024 03:24 PM EST RP Workstation: [id number]    Review of Systems  Constitutional: Negative.   Musculoskeletal:        Right ankle pain  Skin: Negative.   Neurological: Negative.   All other systems reviewed and are negative.  Blood pressure 134/73, pulse 75, temperature 98.6 F (37 C), temperature source Oral, resp. rate 17, height 5' 3 (1.6 m), weight 81.6 kg, SpO2 97%. Physical Exam HENT:     Head: Atraumatic.     Mouth/Throat:     Mouth: Mucous membranes are moist.  Eyes:     Extraocular Movements: Extraocular movements intact.  Cardiovascular:     Rate and Rhythm: Normal rate.  Pulmonary:     Effort: Pulmonary effort is normal.  Abdominal:     Palpations: Abdomen is soft.  Musculoskeletal:     Cervical back: Neck supple.     Comments: Right ankle in a short leg splint.  Splint is bulky and foot is inverted due to fracture.  No tenderness distal to the splint.  No tenderness proximal to the splint.  Wiggles toes.  Sensation intact.  Skin:    General: Skin is warm.  Neurological:     General: No focal deficit present.     Mental Status: She is alert.  Psychiatric:        Mood and Affect: Mood normal.     Assessment/Plan: Patient has a right trimalleolar  ankle fracture dislocation.  Plan will be  for open reduction of her ankle fracture and open reduction of her ankle joint.  She will likely require syndesmosis fixation and possibly posterior malleolar fixation due to the size of her fragment.  We will make that determination intraoperatively.  Postoperatively she will be in a splint and nonweightbearing.  She will be suitable for discharge from an orthopedic standpoint.  Lonni JONELLE Pae 10/23/2024, 9:05 AM

## 2024-10-23 NOTE — Anesthesia Procedure Notes (Signed)
 Procedure Name: Intubation Date/Time: 10/23/2024 10:33 AM  Performed by: Jalisha Enneking J, CRNAPre-anesthesia Checklist: Patient identified, Emergency Drugs available, Suction available and Patient being monitored Patient Re-evaluated:Patient Re-evaluated prior to induction Oxygen Delivery Method: Circle System Utilized Preoxygenation: Pre-oxygenation with 100% oxygen Induction Type: IV induction Ventilation: Mask ventilation without difficulty Laryngoscope Size: Miller and 3 Grade View: Grade I Tube type: Oral Tube size: 7.0 mm Number of attempts: 1 Airway Equipment and Method: Stylet and Oral airway Placement Confirmation: ETT inserted through vocal cords under direct vision, positive ETCO2 and breath sounds checked- equal and bilateral Secured at: 21 cm Tube secured with: Tape Dental Injury: Teeth and Oropharynx as per pre-operative assessment

## 2024-10-23 NOTE — Transfer of Care (Signed)
 Immediate Anesthesia Transfer of Care Note  Patient: Elizabeth Yoder  Procedure(s) Performed: OPEN REDUCTION INTERNAL FIXATION (ORIF) ANKLE FRACTURE (Right: Ankle)  Patient Location: PACU  Anesthesia Type:General and GA combined with regional for post-op pain  Level of Consciousness: drowsy  Airway & Oxygen Therapy: Patient Spontanous Breathing and Patient connected to face mask oxygen  Post-op Assessment: Report given to RN and Post -op Vital signs reviewed and stable  Post vital signs: Reviewed and stable  Last Vitals:  Vitals Value Taken Time  BP    Temp    Pulse    Resp    SpO2      Last Pain:  Vitals:   10/23/24 0915  TempSrc:   PainSc: 7          Complications: No notable events documented.

## 2024-10-23 NOTE — Assessment & Plan Note (Signed)
 Rheumatoid Arthritis : Hold Methotrexate 15 mg weekly, Plaquenil 300 mg daily

## 2024-10-23 NOTE — Assessment & Plan Note (Signed)
 Unable to sleep last night - Continue traZODone  100 mg QHS

## 2024-10-23 NOTE — TOC CM/SW Note (Signed)
 Transition of Care [facility]) - Inpatient Brief Assessment   Patient Details  Name: Elizabeth Yoder MRN: [id number] Date of Birth: 1956/05/29  Transition of Care [facility]) CM/SW Contact:    Lauraine FORBES Saa, LCSWA Phone Number: 10/23/2024, 3:30 PM   Clinical Narrative:  3:30 PM Per chart review, patient resides at home with significant other and child(ren). Patient has insurance and informed CSW that she has a PCP (Hayden Ebb Cone, DNP, FNP-C of [facility]). Patient does not have SNF/HH/DME history. Patient's preferred pharmacy is [facility] [phone] [facility]. No TOC needs identified at this time. TOC will continue to follow.  Transition of Care Asessment: Insurance and Status: Insurance coverage has been reviewed Patient has primary care physician: Yes Kriss Ebb Cone, DNP, FNP-C of [facility]) Home environment has been reviewed: Private Residence Prior level of function:: N/A Prior/Current Home Services: No current home services Social Drivers of Health Review: SDOH reviewed no interventions necessary Readmission risk has been reviewed: Yes (Currently Observation Status) Transition of care needs: no transition of care needs at this time

## 2024-10-23 NOTE — Anesthesia Procedure Notes (Signed)
 Anesthesia Regional Block: Adductor canal block   Pre-Anesthetic Checklist: , timeout performed,  Correct Patient, Correct Site, Correct Laterality,  Correct Procedure, Correct Position, site marked,  Risks and benefits discussed,  Surgical consent,  Pre-op evaluation,  At surgeon's request and post-op pain management  Laterality: Lower and Right  Prep: chloraprep       Needles:  Injection technique: Single-shot  Needle Type: Stimiplex     Needle Length: 9cm  Needle Gauge: 21     Additional Needles:   Procedures:,,,, ultrasound used (permanent image in chart),,    Narrative:  Start time: 10/23/2024 8:58 AM End time: 10/23/2024 9:13 AM Injection made incrementally with aspirations every 5 mL.  Performed by: Personally  Anesthesiologist: Darlyn Rush, MD  Additional Notes: BP cuff, EKG monitors applied. Sedation begun. Artery and nerve location verified with ultrasound. Anesthetic injected incrementally (5ml), slowly, and after negative aspirations under direct u/s guidance. Good fascial/perineural spread. Tolerated well.

## 2024-10-23 NOTE — Anesthesia Procedure Notes (Signed)
 Anesthesia Regional Block: Popliteal block   Pre-Anesthetic Checklist: , timeout performed,  Correct Patient, Correct Site, Correct Laterality,  Correct Procedure, Correct Position, site marked,  Risks and benefits discussed,  Surgical consent,  Pre-op evaluation,  At surgeon's request and post-op pain management  Laterality: Lower and Right  Prep: chloraprep       Needles:  Injection technique: Single-shot  Needle Type: Stimiplex     Needle Length: 10cm  Needle Gauge: 21     Additional Needles:   Procedures:,,,, ultrasound used (permanent image in chart),,   Motor weakness within 5 minutes.  Narrative:  Start time: 10/23/2024 9:13 AM End time: 10/23/2024 9:20 AM Injection made incrementally with aspirations every 5 mL.  Performed by: Personally  Anesthesiologist: Darlyn Rush, MD  Additional Notes: Nerve located and needle positioned with direct ultrasound guidance. Good perineural spread. Patient tolerated well.

## 2024-10-23 NOTE — Hospital Course (Signed)
 Elizabeth Yoder is a 68 y.o. year old with a history of Rheumatoid arthritis who presented with right ankle pain radiograph found to have Stage IV Weber B fracture of the right ankle and was admitted to the [facility] Medicine Teaching Service for Ankle fracture  Ankle fracture  Elizabeth Yoder is a 68 y.o. female presenting with Right ankle fracture secondary to mechanical fall. Radiograph shows significantly displaced trimalleolar fracture-dislocation of the ankle she is now s/p Right ankle closed reduction w/ ortho surgery. Per Ortho surgery She will continue Nonweightbearing for 6-8 weeks after surgery right lower extremity in splint. Keep splint clean, dry, intact and start taking Aspirin  for DVT prophylaxis outpatient. Plan for outpatient follow-up 2 weeks from surgery with Dr. Elsa for splint removal, staple removal if appropriate, and radiographs of the right ankle in nonweightbearing on arrival.  She will likely be transitioned to a cast at that time.   Other chronic conditions were medically managed with home medications and formulary alternatives as necessary   PCP Follow-up Recommendations: Follow up with sleep hygiene

## 2024-10-23 NOTE — Plan of Care (Signed)

## 2024-10-23 NOTE — Progress Notes (Addendum)
     Daily Progress Note Intern Pager: [phone]  Patient name: Elizabeth Yoder [facility] Medical record number: [id number] Date of birth: 1956/02/16 Age: 68 y.o. Gender: female  Primary Care Provider: Patient, No Pcp Per Consultants: Ortho Surgery Code Status: Full Code  Pt Overview and Major Events to Date:  11/24 : Admitted   Mr. Novoa is a 68 y.o. female presenting with Right ankle fracture secondary to mechanical fall.  Assessment & Plan Ankle fracture S/p Fall x-ray shows Significantly displaced trimalleolar fracture-dislocation of the ankle  - Vital signs per floor - Ortho surgery consults : Plan for OR today - Pain control: Tylenol  650 mg q6h, oxycodone  5 mg q4h prn, dilaudud 0.5-1 mg q2h prn - Bowel Reg: Miralax , Senna  - AM Labs: BMP, CBC - Fall precautions - PT/OT consult s/p surgery   Unable to sleep Unable to sleep last night - Continue traZODone  100 mg QHS Chronic health problem Rheumatoid Arthritis : Hold Methotrexate 15 mg weekly, Plaquenil 300 mg daily   FEN/GI: NPO  PPx: Lovenox   Dispo:Pending PT recommendations    Subjective:  Rest comfortable in bed. But states had N/V yesterday. State unable to sleep throughout the night. Anxious OVN required Atarax .   Objective: Temp:  [97.5 F (36.4 C)-98.6 F (37 C)] 98.6 F (37 C) (11/25 0821) Pulse Rate:  [70-82] 75 (11/25 0821) Resp:  [11-22] 17 (11/25 0821) BP: (104-147)/(48-88) 134/73 (11/25 0821) SpO2:  [94 %-100 %] 97 % (11/25 0821) Weight:  [81.6 kg] 81.6 kg (11/25 0821) Physical Exam: General: Non-toxic  Cardiovascular: RRR S1S2 Respiratory: No distress on RA Abdomen: Soft, non tender Last BM 10/21/24 Extremities: +2 PD bilaterally Right foot swollen , intact sensory and motor   Laboratory: Most recent CBC Lab Results  Component Value Date   WBC 5.6 10/23/2024   HGB 12.7 10/23/2024   HCT 38.8 10/23/2024   MCV 93.7 10/23/2024   PLT 174 10/23/2024   Most recent BMP    Latest Ref Rng & Units  10/23/2024    5:17 AM  BMP  Glucose 70 - 99 mg/dL 86   BUN 8 - 23 mg/dL 16   Creatinine 9.55 - 1.00 mg/dL 8.99   Sodium 864 - 854 mmol/L 138   Potassium 3.5 - 5.1 mmol/L 4.2   Chloride 98 - 111 mmol/L 103   CO2 22 - 32 mmol/L 27   Calcium  8.9 - 10.3 mg/dL 8.7     Imaging/Diagnostic Tests: Narrative & Impression EXAM: CT RIGHT ANKLE, WITHOUT IV CONTRAST 10/22/2024 05:39:32 PM  IMPRESSION: 1. Stage IV Weber B fracture of the right ankle with oblique lateral malleolar component with mild comminution, oblique posterior malleolar component, and transverse medial malleolar component. 2. Tibiotalar malalignment with 1.6 cm anterior dislocation of the tibial shaft with respect to the talus. 3. Prominent narrowing of the tibiotalar articular space suggesting underlying chondral thinning. 4. Degenerative subcortical cysts along the proximal talus, tibial plafond, and medial malleolus. 5. Thickened medial band of the plantar fascia, which may reflect plantar fasciitis.   Electronically signed by: Ryan Salvage MD 10/22/2024 05:54 PM EST RP Workstation: [id number]   Suzen [place] NOVAK, DO 10/23/2024, 8:38 AM  PGY-1, [facility] Family Medicine FPTS Intern pager: [phone], text pages welcome Secure chat group [facility] Teaching Service

## 2024-10-23 NOTE — Plan of Care (Signed)
 FMTS Brief Progress Note  S:s/p  Right ankle closed reduction w/ ortho surgery. Rest comfortable in bed. Denies pain. Visitor at bedside   O: BP 117/67 (BP Location: Left Arm)   Pulse 83   Temp 98.4 F (36.9 C)   Resp 18   Ht 5' 3 (1.6 m)   Wt 81.6 kg   SpO2 95%   BMI 31.89 kg/m    RLE : still numb post surgery, unable to move toes w/ remain sensory deficit. Cap refill <2sec : Ortho surgery aware  A/P: - NWB per ortho surgery - Regular diet - Pain regimen : Tylenol  schedule, Oxycodone  and Dilaudid  PRN   Suzen [place] NOVAK, DO 10/23/2024, 2:39 PM PGY-1, [facility] Medicine Resident  Please page [phone] with questions.

## 2024-10-24 ENCOUNTER — Encounter (HOSPITAL_COMMUNITY): Payer: Self-pay | Admitting: Orthopaedic Surgery

## 2024-10-24 ENCOUNTER — Other Ambulatory Visit (HOSPITAL_COMMUNITY): Payer: Self-pay

## 2024-10-24 DIAGNOSIS — M0600 Rheumatoid arthritis without rheumatoid factor, unspecified site: Secondary | ICD-10-CM | POA: Diagnosis not present

## 2024-10-24 LAB — BASIC METABOLIC PANEL WITH GFR
Anion gap: 9 (ref 5–15)
BUN: 14 mg/dL (ref 8–23)
CO2: 23 mmol/L (ref 22–32)
Calcium: 8.7 mg/dL — ABNORMAL LOW (ref 8.9–10.3)
Chloride: 105 mmol/L (ref 98–111)
Creatinine, Ser: 1.04 mg/dL — ABNORMAL HIGH (ref 0.44–1.00)
GFR, Estimated: 59 mL/min — ABNORMAL LOW (ref >60)
Glucose, Bld: 149 mg/dL — ABNORMAL HIGH (ref 70–99)
Potassium: 4 mmol/L (ref 3.5–5.1)
Sodium: 137 mmol/L (ref 135–145)

## 2024-10-24 LAB — CBC
HCT: 35.5 % — ABNORMAL LOW (ref 36.0–46.0)
Hemoglobin: 11.9 g/dL — ABNORMAL LOW (ref 12.0–15.0)
MCH: 31.2 pg (ref 26.0–34.0)
MCHC: 33.5 g/dL (ref 30.0–36.0)
MCV: 93.2 fL (ref 80.0–100.0)
Platelets: 162 K/uL (ref 150–400)
RBC: 3.81 MIL/uL — ABNORMAL LOW (ref 3.87–5.11)
RDW: 14.4 % (ref 11.5–15.5)
WBC: 11.2 K/uL — ABNORMAL HIGH (ref 4.0–10.5)
nRBC: 0 % (ref 0.0–0.2)

## 2024-10-24 LAB — MAGNESIUM: Magnesium: 1.9 mg/dL (ref 1.7–2.4)

## 2024-10-24 MED ORDER — ONDANSETRON 4 MG PO TBDP
4.0000 mg | ORAL_TABLET | Freq: Three times a day (TID) | ORAL | 0 refills | Status: AC | PRN
  Filled 2024-10-24: qty 5, 2d supply, fill #0

## 2024-10-24 MED ORDER — ASPIRIN 325 MG PO TABS
ORAL_TABLET | ORAL | 0 refills | Status: AC
  Filled 2024-10-24: qty 30, 30d supply, fill #0

## 2024-10-24 MED ORDER — TRAZODONE HCL 100 MG PO TABS
100.0000 mg | ORAL_TABLET | Freq: Every evening | ORAL | 0 refills | Status: AC | PRN
  Filled 2024-10-24: qty 15, 15d supply, fill #0

## 2024-10-24 MED ORDER — OXYCODONE HCL 5 MG PO TABS
5.0000 mg | ORAL_TABLET | ORAL | 0 refills | Status: AC | PRN
  Filled 2024-10-24: qty 30, 5d supply, fill #0

## 2024-10-24 MED ORDER — ENOXAPARIN SODIUM 40 MG/0.4ML IJ SOSY
40.0000 mg | PREFILLED_SYRINGE | INTRAMUSCULAR | Status: DC
  Administered 2024-10-24: 40 mg via SUBCUTANEOUS
  Filled 2024-10-24: qty 0.4

## 2024-10-24 NOTE — Assessment & Plan Note (Signed)
 S/p Fall x-ray shows Significantly displaced trimalleolar fracture-dislocation of the ankle. She is now s/p  ORIF of trimalleolar ankle fracture w/ Ortho surgery on 11/25 - Vital signs per floor - Ortho surgery consults, appreciate recommendation   - Nonweightbearing right lower extremity in splint for 6-8 week post-op - Keep splint clean, dry, intact - Up with PT today - Lovenox  for DVT prophylaxis while inpatient.   - Aspirin  for DVT prophylaxis outpatient.   - Rx for oxycodone  for outpatient pain control  - Plan for outpatient follow-up 2 weeks from surgery with Dr. Elsa for splint removal, staple removal if appropriate, and radiographs of the right ankle in nonweightbearing on arrival.  She will likely be transitioned to a cast at that time.   - Pain control: Tylenol  650 mg q6h, oxycodone  5 mg q4h prn, dilaudud 0.5-1 mg q2h prn - Bowel Reg: Miralax , Senna  - AM Labs: BMP, CBC - Fall precautions - PT/OT consulted, pending recommendation

## 2024-10-24 NOTE — Plan of Care (Signed)

## 2024-10-24 NOTE — Progress Notes (Signed)
     Elizabeth Yoder is a 68 y.o. female   Orthopaedic diagnosis: Status post open treatment of right trimalleolar ankle fracture and syndesmosis 10/23/2024  Subjective: Patient resting comfortably.  Pain well-controlled.  She is getting some return of motor function in the toes and sensation following nerve block.  She lives at home with a friend and plans to have her son help her postoperatively.  She is looking forward to discharge home.  Lovenox  was not ordered postoperatively yesterday due to pork allergy noted in chart.  Patient however says she does not have a true pork allergy but rather just does not like to eat pork.  Objectyive: Vitals:   10/23/24 2050 10/24/24 0430  BP: (!) 142/79 117/68  Pulse: 89 80  Resp: 18 18  Temp: 98 F (36.7 C) 98.3 F (36.8 C)  SpO2: 97% 96%     Exam: Awake and alert Respirations even and unlabored No acute distress  Right lower extremity with short leg splint in place.  Exposed skin is benign.  She is now able to wiggle the toes some actively.  She endorses altered sensation light touch in the toes due to intact nerve block.  Proximal calf soft and nontender.  She tolerates range of motion at the knee.  Toes are warm and well-perfused distally.  Assessment: Postop day 1 status post the above, suitable for discharge from an orthopedic standpoint.   Plan: -Nonweightbearing right lower extremity in splint.  Keep splint clean, dry, intact -Up with PT today -Lovenox  for DVT prophylaxis while inpatient.  Will order this for this morning.  Aspirin  for DVT prophylaxis outpatient.  Sent to [facility] pharmacy - Rx for oxycodone  for outpatient pain control sent to [facility] pharmacy  Plan for outpatient follow-up 2 weeks from surgery with Dr. Elsa for splint removal, staple removal if appropriate, and radiographs of the right ankle in nonweightbearing on arrival.  She will likely be transitioned to a cast at that time.  We did discuss nonweightbearing 6 to 8  weeks post op.  Patient understands.   Elizabeth Yoder J. Trany Chernick, PA-C

## 2024-10-24 NOTE — Progress Notes (Signed)
     Daily Progress Note Intern Pager: [phone]  Patient name: Elizabeth Yoder [facility] Medical record number: [id number] Date of birth: 02-07-1956 Age: 68 y.o. Gender: female  Primary Care Provider: Patient, No Pcp Per Consultants: Ortho Surgery Code Status: Full Code  Pt Overview and Major Events to Date:  11/24 : Admitted   [name] is a 67 y.o. female presenting with Right ankle fracture secondary to mechanical fall. She is now s/p right ankle closed reduction w/ ortho surgery on 10/23/24  Assessment & Plan Ankle fracture S/p Fall x-ray shows Significantly displaced trimalleolar fracture-dislocation of the ankle. She is now s/p  ORIF of trimalleolar ankle fracture w/ Ortho surgery on 11/25 - Vital signs per floor - Ortho surgery consults, appreciate recommendation   - Nonweightbearing right lower extremity in splint for 6-8 week post-op - Keep splint clean, dry, intact - Up with PT today - Lovenox  for DVT prophylaxis while inpatient.   - Aspirin  for DVT prophylaxis outpatient.   - Rx for oxycodone  for outpatient pain control  - Plan for outpatient follow-up 2 weeks from surgery with Dr. Elsa for splint removal, staple removal if appropriate, and radiographs of the right ankle in nonweightbearing on arrival.  She will likely be transitioned to a cast at that time.   - Pain control: Tylenol  650 mg q6h, oxycodone  5 mg q4h prn, dilaudud 0.5-1 mg q2h prn - Bowel Reg: Miralax , Senna  - AM Labs: BMP, CBC - Fall precautions - PT/OT consulted, pending recommendation   Unable to sleep Sleep improved last night - Continue traZODone  100 mg QHS Chronic health problem Rheumatoid Arthritis : Hold Methotrexate 15 mg weekly, Plaquenil 300 mg daily   FEN/GI: Regular PPx: Lovenox   Dispo:Pending PT recommendations    Subjective:  Doing well this morning. Slept well last night. Denies surgical pain. Last BM pre admission per patient  Objective: Temp:  [98 F (36.7 C)-99.1 F (37.3 C)] 98.6 F  (37 C) (11/26 9191) Pulse Rate:  [72-89] 78 (11/26 0808) Resp:  [17-21] 18 (11/26 0808) BP: (116-142)/(60-79) 125/73 (11/26 0808) SpO2:  [90 %-99 %] 96 % (11/26 9191) Physical Exam: General: Non-toxic  Cardiovascular: RRR S1S2 Respiratory: No distress on RA Abdomen: Soft, non tender Last BM 10/21/24 Extremities: RLE w/ cast CDI, LLE 2+ PD   Laboratory: Most recent CBC Lab Results  Component Value Date   WBC 11.2 (H) 10/24/2024   HGB 11.9 (L) 10/24/2024   HCT 35.5 (L) 10/24/2024   MCV 93.2 10/24/2024   PLT 162 10/24/2024   Most recent BMP    Latest Ref Rng & Units 10/24/2024    3:45 AM  BMP  Glucose 70 - 99 mg/dL 850   BUN 8 - 23 mg/dL 14   Creatinine 9.55 - 1.00 mg/dL 8.95   Sodium 864 - 854 mmol/L 137   Potassium 3.5 - 5.1 mmol/L 4.0   Chloride 98 - 111 mmol/L 105   CO2 22 - 32 mmol/L 23   Calcium  8.9 - 10.3 mg/dL 8.7     Imaging/Diagnostic Tests: No new imaging in last 24 hrs   Suzen Houston NOVAK, DO 10/24/2024, 9:12 AM  PGY-1, [facility] FPTS Intern pager: [phone], text pages welcome Secure chat group [facility] Teaching Service

## 2024-10-24 NOTE — Evaluation (Signed)
 Occupational Therapy Evaluation Patient Details Name: Elizabeth Yoder MRN: [id number] DOB: 1955/12/27 Today's Date: 10/24/2024   History of Present Illness   68 y.o. female admitted 10/22/24 after fall sustaining R trimalleolar ankle fx dislocation; attempted closed reduction with persistent subluxation. S/p R ankle ORIF 11/25. PMH includes RA, NASH, GERD.     Clinical Impressions At baseline, pt is Independent with ADLs, IADLs, and functional mobility without an AD. Pt drives and works. Pt now presents with decreased activity tolerance, decreased knowledge of compensatory strategies for completing tasks while adhering to R LE NWB; decreased knowledge of AE/DME, decreased B UE strength, decreased balance, and decreased safety and independence with functional tasks. Pt currently demonstrating ability to complete ADLs largely with Mod I to Min assist, bed mobility with Mod I to Mod assist, and functional transfers with a RW with Contact guard assist. Pt participated well in session. Suspect pt will make quick progress toward goals. Pt will benefit from acute skilled OT services to address deficits and increase safety and independence with functional tasks. Post acute discharge, pt will benefit from [facility] OT and increased support/check-in from friends and family.      If plan is discharge home, recommend the following:   A little help with walking and/or transfers;A little help with bathing/dressing/bathroom;Assistance with cooking/housework;Assist for transportation;Help with stairs or ramp for entrance     Functional Status Assessment   Patient has had a recent decline in their functional status and demonstrates the ability to make significant improvements in function in a reasonable and predictable amount of time.     Equipment Recommendations   Other (comment);BSC/3in1 (RW; reacher; handheld shower head; gait belt provided by OT this day; pt reports she can borrow [facility] ([facility]) from her sister)      Recommendations for Other Services         Precautions/Restrictions   Precautions Precautions: Fall Recall of Precautions/Restrictions: Intact Required Braces or Orthoses: Splint/Cast Splint/Cast: R LE Splint/Cast - Date Prophylactic Dressing Applied (if applicable): 10/23/24 Restrictions Weight Bearing Restrictions Per Provider Order: Yes RLE Weight Bearing Per Provider Order: Non weight bearing     Mobility Bed Mobility Overal bed mobility: Needs Assistance Bed Mobility: Sit to Supine       Sit to supine: Mod assist, HOB elevated   General bed mobility comments: Pt requiring Mod assist to elevate R LE into bed; cues for hand placement/technique    Transfers Overall transfer level: Needs assistance Equipment used: Rolling walker (2 wheels) Transfers: Sit to/from Stand, Bed to chair/wheelchair/BSC Sit to Stand: Contact guard assist     Step pivot transfers: Contact guard assist     General transfer comment: sit<>stand from EOB, BSC and recliner to RW, cues for hand placement and sequencing, pt with good ability to extend R foot in air to ensure NWB; pt also trained in techniques for and practiced simulated car transfers at EOB with CGA-Min assist and cues needed for technique      Balance Overall balance assessment: Needs assistance Sitting-balance support: No upper extremity supported Sitting balance-Leahy Scale: Good Sitting balance - Comments: indep to don L sock sitting EOB, indep toileting/pericare sitting on BSC   Standing balance support: Bilateral upper extremity supported, During functional activity, Reliant on assistive device for balance, Single extremity supported Standing balance-Leahy Scale: Poor Standing balance comment: reliant on RW support; adhering to R LE NWB  ADL either performed or assessed with clinical judgement   ADL Overall ADL's : Needs assistance/impaired Eating/Feeding: Modified  independent;Sitting   Grooming: Set up;Sitting   Upper Body Bathing: Set up;Supervision/ safety;Sitting   Lower Body Bathing: Contact guard assist;Minimal assistance;Cueing for compensatory techniques;Sitting/lateral leans;Sit to/from stand (adhering to R LE NWB)   Upper Body Dressing : Set up;Sitting   Lower Body Dressing: Minimal assistance;Sit to/from stand;Cueing for compensatory techniques (adhering to R LE NWB) Lower Body Dressing Details (indicate cue type and reason): Assist needed to thread R LE in/out of clothing; otherwise Set-up-Superviaion; would likely benefit from AE for LB dressing Toilet Transfer: Contact guard assist;BSC/3in1;Rolling walker (2 wheels) (step-pivot transfer; cues for hand placement/technique; adhering to R LE NWB)   Toileting- Clothing Manipulation and Hygiene: Set up;Contact guard assist;Sitting/lateral lean;Sit to/from stand (adhering to R LE NWB) Toileting - Clothing Manipulation Details (indicate cue type and reason): Set up for toileting hygiene in sitting; CGA for clothing managment in sit/stand       General ADL Comments: Pt with decreased activity tolerance. OT educated pt in options for LB clothing that will work well with current R LE cast and in importance of wearing proper footwear or non-skid socks on L LE to decrease risk of further falls with pt verbalizing understanding of education.     Vision Baseline Vision/History: 1 Wears glasses (readers) Ability to See in Adequate Light: 0 Adequate (with glasses on) Patient Visual Report: No change from baseline Additional Comments: Vision [facility] for tasks assessed; not formally screened or evaluated     Perception         Praxis         Pertinent Vitals/Pain Pain Assessment Pain Assessment: 0-10 Pain Score: 5  Pain Location: R LE, lower back with sitting up an extended period and practicing functional transfers Pain Descriptors / Indicators: Discomfort, Grimacing, Guarding, Sore Pain  Intervention(s): Limited activity within patient's tolerance, Monitored during session, Premedicated before session, Repositioned     Extremity/Trunk Assessment Upper Extremity Assessment Upper Extremity Assessment: Right hand dominant;Generalized weakness;RUE deficits/detail;LUE deficits/detail (hx of RA) RUE Deficits / Details: generalized weakness; mildly decreased ROM in all digits of hand consistent with RA and not affecting functional level; AROM otherwise WFL; coordination WFL RUE Sensation: WNL RUE Coordination: WNL LUE Deficits / Details: generalized weakness (worse on Left, which pt reports in baseline); mildly decreased ROM in all digits of hand consistent with RA and not affecting functional level; AROM otherwise WFL; coordination WFL LUE Sensation: WNL LUE Coordination: WNL   Lower Extremity Assessment Lower Extremity Assessment: Defer to PT evaluation RLE Deficits / Details: s/p R ankle ORIF immobilized in hard cast, expected postop pain and weakness; good quad activation able to perform full range LAQ, unable to perform full SLR       Communication Communication Communication: No apparent difficulties   Cognition Arousal: Alert Behavior During Therapy: WFL for tasks assessed/performed Cognition: No apparent impairments             OT - Cognition Comments: Pt AAOX4 and pleasant throughout session with cogntiion [facility] for tasks assessed                 Following commands: Intact       Cueing  General Comments   Cueing Techniques: Verbal cues;Visual cues;Gestural cues  Answerpt questions and provided education regarding role of OT in acute and HH setting. Also educated pt in importance of staying as active as possible and sitting up during a good protion  of the day to help prevent complications due to inactivity/lying flat in bed for extended time. Pt verbalized understanding of all training.   Exercises     Shoulder Instructions      Home Living  Family/patient expects to be discharged to:: Private residence Living Arrangements: Non-relatives/Friends (roommate) Available Help at Discharge: Family;Friend(s);Personal care attendant (Pt's roommate, son, and sister can all assist PRN, but all work during the day on most days. Roommate leaves for work around 6:30 am) Type of Home: Apartment (on the first floor of the apartment building) Home Access: Stairs to enter;Level entry Entrance Stairs-Number of Steps: 2 (2 very low steps to enter at main door; another entrence with a level entry available at rear of apartment building) Entrance Stairs-Rails: Right Home Layout: One level     Bathroom Shower/Tub: Producer, Television/film/video: Standard Bathroom Accessibility: Yes How Accessible: Accessible via walker;Accessible via wheelchair Home Equipment: Shower seat - built in   Additional Comments: pt does not own any DME; can borrow DME from sister, including manual w/c with elevating leg rests, rollator, standard walker, BSC      Prior Functioning/Environment Prior Level of Function : Independent/Modified Independent;Working/employed;Driving             Mobility Comments: Independent without an AD ADLs Comments: Independent with ADLs and IADLs; drives; 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, assisting her with ADL/IADLs and transportation to appointments    OT Problem List: Decreased strength;Decreased activity tolerance;Impaired balance (sitting and/or standing);Decreased knowledge of use of DME or AE;Decreased knowledge of precautions   OT Treatment/Interventions: Self-care/ADL training;Energy conservation;DME and/or AE instruction;Therapeutic exercise;Therapeutic activities;Patient/family education;Balance training      OT Goals(Current goals can be found in the care plan section)   Acute Rehab OT Goals Patient Stated Goal: to heal well and be able to return to her usual routine and PLOF OT Goal Formulation: With  patient Time For Goal Achievement: 11/07/24 Potential to Achieve Goals: Good ADL Goals Pt Will Perform Lower Body Bathing: with modified independence;sitting/lateral leans;sit to/from stand;with adaptive equipment (adhering to R LE NWB) Pt Will Perform Lower Body Dressing: with modified independence;with adaptive equipment;sitting/lateral leans;sit to/from stand (adhering to R LE NWB) Pt Will Transfer to Toilet: with modified independence;ambulating;regular height toilet (with least restricitve AD; adhering to R LE NWB) Additional ADL Goal #1: Patient will demonstrate ability to Independently state 3 energy conservation strategies to increase safety and independence with functional tasks with handout provided.   OT Frequency:  Min 2X/week    Co-evaluation              AM-PAC OT 6 Clicks Daily Activity     Outcome Measure Help from another person eating meals?: None Help from another person taking care of personal grooming?: A Little Help from another person toileting, which includes using toliet, bedpan, or urinal?: A Little Help from another person bathing (including washing, rinsing, drying)?: A Little Help from another person to put on and taking off regular upper body clothing?: A Little Help from another person to put on and taking off regular lower body clothing?: A Little 6 Click Score: 19   End of Session Equipment Utilized During Treatment: Gait belt;Rolling walker (2 wheels);Other (comment) [facility]) Nurse Communication: Mobility status  Activity Tolerance: Patient tolerated treatment well Patient left: in bed;with call bell/phone within reach;with bed alarm set  OT Visit Diagnosis: Unsteadiness on feet (R26.81);Other abnormalities of gait and mobility (R26.89);Muscle weakness (generalized) (M62.81)  Time: 8982-8881 OT Time Calculation (min): 61 min Charges:  OT General Charges $OT Visit: 1 Visit OT Evaluation $OT Eval Moderate Complexity: 1 Mod OT  Treatments $Self Care/Home Management : 38-52 mins  Margarie Rockey HERO., OTR/L, MA Acute Rehab [phone]   Margarie FORBES Horns 10/24/2024, 11:54 AM

## 2024-10-24 NOTE — Op Note (Signed)
 Elizabeth Yoder female 68 y.o. 10/23/2024  PreOperative Diagnosis: Right trimalleolar ankle fracture dislocation Syndesmosis disruption  PostOperative Diagnosis: Same  PROCEDURE: Open reduction internal fixation of right trimalleolar ankle fracture without posterior fixation Open treatment of syndesmosis with internal fixation Ankle stress view fluoroscopy  SURGEON: Lonni Pae, MD  ASSISTANT: Jesse Jordan, PA-C was necessary for patient positioning, prep assistance with fracture reduction, placement of hardware and closure  ANESTHESIA: General with peripheral nerve block  FINDINGS: See below  IMPLANTS: Arthrex distal fibular locking plate, partially-threaded cannulated screws and fully threaded screws  INDICATIONS:68 y.o. female sustained the injury in a fall.  She had dislocation of her ankle and underwent attempted closed reduction in the emergency department that was unsuccessful.  She was admitted to the hospitalist service and scheduled for surgery.   Patient understood the risks, benefits and alternatives to surgery which include but are not limited to wound healing complications, infection, nonunion, malunion, need for further surgery as well as damage to surrounding structures. They also understood the potential for continued pain in that there were no guarantees of acceptable outcome After weighing these risks the patient opted to proceed with surgery.  PROCEDURE: Patient was identified in the preoperative holding area.  The right leg was marked by myself.  Consent was signed by myself and the patient.  Block was performed by anesthesia in the preoperative holding area.  Patient was taken to the operative suite and placed supine on the operative table.  General LMA anesthesia was induced without difficulty. Bump was placed under the operative hip and bone foam was used.  All bony prominences were well padded.  Tourniquet was placed on the operative thigh.   Preoperative antibiotics were given. The extremity was prepped and draped in the usual sterile fashion and surgical timeout was performed.  The limb was elevated and the tourniquet was inflated to 250 mmHg.  We began by making a longitudinal incision overlying the fibula.  This was taken sharply down through skin and subcutaneous tissue.  Blunt dissection was used to identify any branch of the superficial peroneal nerve that was identified and protected through the entirety of the case.  The incision was then taken sharply down to bone and the fracture site was identified.  The fracture site was mobilized.   The fracture site were cleaned with a rondure and curette of any fracture hematoma and callus formation.  Then the fracture of the fibula was reduced under direct visualization and held provisionally with a lobster claw.  Then fluoroscopy confirmed adequate reduction of the ankle mortise at that time.  Then a one third tubular plate was placed across the fracture site for fixation.  This provided good stability of the distal fibula fracture.    We then turned our attention to the medial malleolus.  There was continued displacement of the medial malleolus and therefore an incision was made overlying this.  This was taken sharply down through skin and subcutaneous tissue.  Bovie cautery was used for skin bleeders.  Then sharp dissection down to the fracture and the fracture site was identified.  The soft tissue flap was carried anteriorly to identify the medial gutter of the ankle joint.  Then the fracture site was mobilized and using a curette and rondure the fracture was cleared out of hematoma and fracture callus.  Then the fracture was reduced and held provisionally with a pointed reduction forcep.  This was done under direct visualization.  Then fluoroscopy confirmed adequate reduction.  2 4.0  mm partially-threaded cannulated screws were placed across the fracture site with good fixation.    We then  proceeded with stress view fluoroscopy of the ankle.  It was noted on stress views that there is widening of the syndesmosis and medial clear space.  We then proceeded with open treatment of the syndesmosis.  Separate incision was created anterior to the fibula to gain access to the syndesmosis.  This was done through the deep tissues.  We gain access to the syndesmosis and the ligaments had been torn.  The syndesmosis was in cleared of any interposed tissue using a rongeur and a Therapist, nutritional.  Then the syndesmosis was reduced under direct visualization and held provisionally with a Weber clamp.  Fluoroscopy confirmed appropriate position of the syndesmosis and closing down the mortise.  We then proceeded placed a fully threaded screw across the syndesmosis.  Postplacement stress views were stable.  Then lateral x-rays were obtained in the posterior malleolar fragment appeared to be well reduced and did not need internal fixation.  Final films were obtained.  The wounds were irrigated and the subcuticular tissue was closed with 3-0 Monocryl and the skin with staples.  Xeroform was placed on the wounds as well as 4 x 4's and sterile sheet cotton.  The tourniquet was released.    Patient was placed in a nonweightbearing short leg splint.  Patient tolerated the procedure well.  There were no complications.  Patient was awakened from anesthesia and taken recovery in stable condition.  POST OPERATIVE INSTRUCTIONS: Nonweightbearing on operative extremity Keep splint dry and limb elevated Continue 325 mg aspirin  for DVT prophylaxis as an outpatient Call the office with concerns Follow-up in 2 weeks for splint removal, x-rays of the operative ankle, nonweightbearing and suture removal if appropriate.    TOURNIQUET TIME: Less than 2 hours  BLOOD LOSS:  Minimal         DRAINS: none         SPECIMEN: none       COMPLICATIONS:  * No complications entered in OR log *         Disposition: PACU -  hemodynamically stable.         Condition: stable

## 2024-10-24 NOTE — Discharge Summary (Addendum)
 Family Medicine Teaching [facility] Discharge Summary  Patient name: Elizabeth Yoder Medical record number: [id number] Date of birth: 19-Aug-1956 Age: 68 y.o. Gender: female Date of Admission: 10/22/2024  Date of Discharge: 10/24/24 Admitting Physician: Damien Pinal, DO  Primary Care Provider: Jesus Elberta Gainer, FNP Consultants: Ortho surgery  Indication for Hospitalization: Right Ankle fracture  Discharge Diagnoses/Problem List:  Principal Problem for Admission:  Other Problems addressed during stay:  Principal Problem:   Ankle fracture Active Problems:   Rheumatoid arthritis (HCC)   Chronic health problem   Unable to sleep   The above problem list has been updated and reviewed for accuracy, including the initial reason for admission.   Brief Hospital Course:  Elizabeth Yoder is a 68 y.o. year old with a history of Rheumatoid arthritis who presented with right ankle pain radiograph found to have Stage IV Weber B fracture of the right ankle and was admitted to the [facility] Medicine Teaching Service for Ankle fracture  Ankle fracture  Mr. Guillermo is a 68 y.o. female presenting with Right ankle fracture secondary to mechanical fall. Radiograph shows significantly displaced trimalleolar fracture-dislocation of the ankle she is now s/p Right ankle closed reduction w/ ortho surgery. Per Ortho surgery She will continue Nonweightbearing for 6-8 weeks after surgery right lower extremity in splint. Keep splint clean, dry, intact and start taking Aspirin  for DVT prophylaxis outpatient. Plan for outpatient follow-up 2 weeks from surgery with Dr. Elsa for splint removal, staple removal if appropriate, and radiographs of the right ankle in nonweightbearing on arrival.  She will likely be transitioned to a cast at that time.   Other chronic conditions were medically managed with home medications and formulary alternatives as necessary   PCP Follow-up Recommendations: Follow up with sleep  hygiene  Naproxen  held at discharge due to being on ASA for 30 days.      Results/Tests Pending at Time of Discharge:  Unresulted Labs (From admission, onward)    None        Disposition: Home  Discharge Condition: Stable  Discharge Exam:  Vitals:   10/24/24 0430 10/24/24 0808  BP: 117/68 125/73  Pulse: 80 78  Resp: 18 18  Temp: 98.3 F (36.8 C) 98.6 F (37 C)  SpO2: 96% 96%   General: Non-toxic  Cardiovascular: RRR S1S2 Respiratory: No distress on RA Abdomen: Soft, non tender Last BM 10/21/24 Extremities: RLE w/ cast CDI, LLE 2+ PD   Significant Procedures: ORIF of trimalleolar ankle fracture w/ Ortho surgery on 11/25   Significant Labs and Imaging:  Recent Labs  Lab 10/22/24 1503 10/23/24 0517 10/24/24 0345  WBC 6.7 5.6 11.2*  HGB 13.1 12.7 11.9*  HCT 40.6 38.8 35.5*  PLT 166 174 162   Recent Labs  Lab 10/22/24 1503 10/23/24 0517 10/24/24 0345  NA 139 138 137  K 4.1 4.2 4.0  CL 103 103 105  CO2 27 27 23   GLUCOSE 118* 86 149*  BUN 20 16 14   CREATININE 1.04* 1.00 1.04*  CALCIUM  8.7* 8.7* 8.7*  MG  --   --  1.9     Discharge Medications:  Allergies as of 10/24/2024       Reactions   Beef Allergy Other (See Comments)   Per patient's preference, does not eat beef.   Bovine (beef) Protein-containing Drug Products    Pork Allergy Other (See Comments)   Per patient's preference, does not eat pork. 09/2024 confirmed with pt, ok to receive enoxaparin   Medication List     STOP taking these medications    naproxen  500 MG tablet Commonly known as: NAPROSYN        TAKE these medications    albuterol  108 (90 Base) MCG/ACT inhaler Commonly known as: VENTOLIN  HFA Inhale 2 puffs into the lungs every 6 (six) hours as needed for shortness of breath or wheezing.   aspirin  325 MG tablet Commonly known as: Bayer Aspirin  Take 1 tablet by mouth once daily for 30 DAYS for blood clot prevention   folic acid 1 MG tablet Commonly known  as: FOLVITE Take 1 mg by mouth daily.   hydroxychloroquine 200 MG tablet Commonly known as: PLAQUENIL Take 300 mg by mouth daily.   methotrexate 2.5 MG tablet Commonly known as: RHEUMATREX Take 6 tablets by mouth once a week. Caution:Chemotherapy. Protect from light.   oxyCODONE  5 MG immediate release tablet Commonly known as: Roxicodone  Take 1 tablet (5 mg total) by mouth every 4 (four) hours as needed (post op pain).   PEPCID PO Take 1 tablet by mouth daily.   traZODone  100 MG tablet Commonly known as: DESYREL  Take 1 tablet (100 mg total) by mouth at bedtime as needed for up to 15 days for sleep. What changed:  when to take this reasons to take this   TYLENOL  PO Take 1 tablet by mouth as needed.   vitamin C 1000 MG tablet Take 1,000 mg by mouth once a week.   Vitamin D (Ergocalciferol) 1.25 MG (50000 UNIT) Caps capsule Commonly known as: DRISDOL Take 50,000 Units by mouth every 7 (seven) days.        Discharge Instructions: Please refer to Patient Instructions section of EMR for full details.  Patient was counseled important signs and symptoms that should prompt return to medical care, changes in medications, dietary instructions, activity restrictions, and follow up appointments.   Follow-Up Appointments:  Follow-up Information     Elsa Lonni SAUNDERS, MD Follow up in 2 week(s).   Specialty: Orthopedic Surgery Contact information: [address] [place] [address] [phone]                 Suzen Houston NOVAK, DO 10/24/2024, 12:12 PM PGY-1, [facility]   I have reviewed the above note, agree with its content, and have made the appropriate changes.   Damien Pinal, DO [facility], PGY-3 10/24/24 12:54 PM

## 2024-10-24 NOTE — TOC Transition Note (Signed)
 Transition of Care [facility]) - Discharge Note   Patient Details  Name: Elizabeth Yoder MRN: [id number] Date of Birth: 1956-08-16  Transition of Care [facility]) CM/SW Contact:  Elizabeth KANDICE Stain, RN Phone Number: 10/24/2024, 12:39 PM   Clinical Narrative:    Patient stable for discharge.  This RNCM offered choice for Home Health, Elizabeth Yoder states she has no preference, RNCM made referral to [facility], She is able to take referral.  Patient requesting [facility] for medicaid application.  Patient requesting resources, added to AVS.  Address(will be staying at temporary address), Phone number and PCP verified.  Final next level of care: Home w Home Health Services Barriers to Discharge: Barriers Resolved   Patient Goals and CMS Choice Patient states their goals for this hospitalization and ongoing recovery are:: stay with friend CMS Medicare.gov Compare Post Acute Care list provided to:: Patient Choice offered to / list presented to : Patient      Discharge Placement                 home      Discharge Plan and Services Additional resources added to the After Visit Summary for                            [facility] Arranged: PT, OT [facility] Agency: [facility] Date [facility] Agency Contacted: 10/24/24 Time HH Agency Contacted: [phone] Representative spoke with at [facility]: [facility]  Social Drivers of Health (SDOH) Interventions SDOH Screenings   Food Insecurity: No Food Insecurity (10/22/2024)  Housing: Low Risk  (10/22/2024)  Transportation Needs: No Transportation Needs (10/22/2024)  Recent Concern: Transportation Needs - Unmet Transportation Needs (10/15/2024)   Received from [facility]  Utilities: Not At Risk (10/22/2024)  Depression (PHQ2-9): Low Risk  (09/22/2019)  Social Connections: Unknown (10/24/2024)  Tobacco Use: Low Risk  (10/23/2024)     Readmission Risk Interventions     No data to display

## 2024-10-24 NOTE — Discharge Instructions (Addendum)
 Dear Elizabeth Yoder,  Thank you for letting us  participate in your care. You were hospitalized for ankle fracture which you undergone closed fracture reduction with Orthopedic surgery.  POST-HOSPITAL & CARE INSTRUCTIONS Go to your follow up appointments (listed below)  DOCTOR'S APPOINTMENT   No future appointments.  Follow-up Information     Elsa Lonni SAUNDERS, MD Follow up in 2 week(s).   Specialty: Orthopedic Surgery Contact information: [address] [place] [address] [phone]                 Take care and be well!  Family Medicine Teaching Service Inpatient Team [place]  [facility]  [address] [place], [place] [address] [phone]  DR. ADAIR FOOT & ANKLE SURGERY POST-OP INSTRUCTIONS   Pain Management The numbing medicine and your leg will last around 18 hours, take a dose of your pain medicine as soon as you feel it wearing off to avoid rebound pain. Keep your foot elevated above heart level.  Make sure that your heel hangs free ('floats'). Take all prescribed medication as directed. If taking narcotic pain medication you may want to use an over-the-counter stool softener to avoid constipation. You may take over-the-counter NSAIDs (ibuprofen, naproxen , etc.) as well as over-the-counter acetaminophen  as directed on the packaging as a supplement for your pain and may also use it to wean away from the prescription medication.  Activity Non-weightbearing Keep splint intact  First Postoperative Visit Your first postop visit will be at least 2 weeks after surgery.  This should be scheduled when you schedule surgery. If you do not have a postoperative visit scheduled please call [phone] to schedule an appointment. At the appointment your incision will be evaluated for suture removal, x-rays will be obtained if necessary.  General Instructions Swelling is very common after foot and ankle surgery.  It often takes 3 months for  the foot and ankle to begin to feel comfortable.  Some amount of swelling will persist for 6-12 months. DO NOT change the dressing.  If there is a problem with the dressing (too tight, loose, gets wet, etc.) please contact Dr. Isiah office. DO NOT get the dressing wet.  For showers you can use an over-the-counter cast cover or wrap a washcloth around the top of your dressing and then cover it with a plastic bag and tape it to your leg. DO NOT soak the incision (no tubs, pools, bath, etc.) until you have approval from Dr. Elsa.  Contact Dr. Nadara office or go to Emergency Room if: Temperature above 101 F. Increasing pain that is unresponsive to pain medication or elevation Excessive redness or swelling in your foot Dressing problems - excessive bloody drainage, looseness or tightness, or if dressing gets wet Develop pain, swelling, warmth, or discoloration of your calf    [facility] Address: [address] [place], [place] [address] Phone Number: [phone] Hours of Operation: Residents of [place] can come to obtain food Monday through Friday from 8:30am until 3:30pm. Photo ID and Social Security cards required for all residents of a household. Can come six times a year  [facility] Address: [address] , [place] [address] Phone Number: [phone] Hours of Operation: Operates Tuesday-Friday 10:00 a.m. to 1 p.m. Requirements: Referral from DSS needed. May come 6 times a year, 30 days apart. Photo ID and SS required for all residents of household.  [facility] Address: [address] [place], [place] [address]  Phone Number: [phone] Hours of Operation: Food pantry is open on the last Saturday of each month from 10:00 am - 12:00 noon. No appointment needed. No qualifications.  [facility] Address: [address] [place], [place] [address] Phone Number: [phone] EXT. 21 Hours of Operation: Must  make reservations to pick up food on Saturdays. Sign ups for Saturday pick up beginning at 8:30 a.m. on Monday morning.  [facility] Address: [address] [place], [place] [address] Phone Number: [phone] Hours of Operation: If you need food, bring proper identification such as a driver's license to receive a bag of food once a month. Requirements: Can come once every 30 days with referral DSS, Holiday Representative, Mental health etc. Each referral good for six visits. Photo ID required. *1st visit no referral required.  [facility] Address: [address] [place], [place] [address] Phone Number: [phone]  [place] [facility] Address: [address] [place], [place] [address] Phone Number: [phone] Hours of Operation:  You can register at [email or web address]/ for free groceries  [facility] Address: [address] [place], [place] [address] Phone Number: [phone] Hours of Operation: Drive through giveaway, first come first served. Every 3rd Saturday 11AM - 1PM  [facility] [address] Address: [address], [place] [address] Phone Number: [phone]   [place]  [facility] Address: [address] [place], [place] [address] Phone Number: [phone] Hours of Operation: Once a month every 3rd Saturday  [facility] Address: [address] [place], [place] [address] Phone Number: [phone] Hours of Operation: Distribution happens from 9:00-10:00 a.m. every Saturday.     [facility] Address: [address] [place], [place] [address] Phone Number: [phone] Hours of Operation: ONCE a week for the community food distribution held every Tuesday, Wednesday and Thursday from 11 a.m. - 2:00 p.m. Food is available on a first come, first serve basis and varies week to week. No appointment necessary for drive thru pick up.  [facility] Address:  [address] [place], [place] [address] Phone Number: [phone] Hours of Operation: Open every 3rd Thursday 9:30 a.m. - 11:00 a.m.  [facility] Address: [address] [place], [place] [address] Phone Number: [phone] Hours of Operation: Please call for hours, directions, and questions  [facility] [place] [facility] Address: [address] [place], [place]  [address] Phone Number: [phone] Website: https://www.hollyguns.co.za Food Finder app: https://findfood.ghpfa.org  [facility] Address: [address] [place], [place] [address] Phone Number: [phone] Hours of Operation: Contact Bree Harpe. Enrolled Substance Abuse Clients Only  [facility] Address: [address] [place], [address]  Phone Number: [phone] Hours of Operation: Contact Bartley Irving. Food pantry open the 3rd Saturday of each month from 9 a.m. -12 p.m. only  [place] [facility] Address: [address] [place], [place] [address] Phone Number: [phone] Hours of Operation: Contact Geni Lee. Emergency food bank open on Saturdays by appointment only  [facility] Address: [address] [place], [place] [address] Phone Number: [phone] Hours of Operation: No specific contact person; Anyone can help  [facility] Address: [address] [place], [place] [address] Phone Number: [phone] Hours of Operation: Contact Medford Molt. Agency gives out a bag of food every Thursday from 2-4 p.m. only, and also provides a community meal every Thursday between 5-6 p.m. Other services provided  include rent/mortgage and utility assistance, women's winter shelter, engineer, materials, and senior adult activities.  [facility] [place] Address: [address] [place], [place] [address] Phone Number: [phone] Hours of Operation: The Emergency Food Assistance Program provides individuals and families with  a generous supply of food including meat, fresh vegetables, and nonperishable items. The food box contains five days' worth of food, and each family or individual can receive a box once per month. M, W, Th, Fr 11am-2pm, walk-ins welcome.  [facility] Address: [address] [place], [place] [address]  Phone Number: [phone] Hours of Operation: Contact Asia Cathlean. Tuesdays and Thursdays from 11am - 3pm by appointment only   Rent/Utility Assistance in [facility]:  [facility] [address] [place], [place] [address] [phone] Mon 8:00am - 6:00pm; Tue 8:00am - 6:00pm; Wed 8:00am - 6:00pm; Thu 8:00am - 6:00pm; Fri 8:00am - 6:00pm; Email: [email or web address] [email or web address] Eligibility: Residents of [place], [place], [place], [place], [place] and [place] that meet income limits. Call or text for eligibility screening.   [facility] [address] [place], [place], [place] [address] [phone] (Main: Rental Assistance) [phone] (Main: Utility Assistance) Mon 8:30am - 5:00pm; Tue 8:30am - 5:00pm; Wed 8:30am - 5:00pm; Thu 8:30am - 5:00pm; Fri 8:30am - 5:00pm; Website: http://www.greensborourbanministry.org/emergency-assistance-program Eligibility: People who have an unexpected crisis or emergency that can be verified. Must have some form of income and meet income limits. At the first of the month, only helps with rent/mortgage assistance for those who have court ordered eviction notices. Call for application information. Call for exact documents that will be needed. Examples of documents that may be needed: Photo ID, Social Security cards for everyone in the household, and proof of income for previous 2 months. Copy of eviction notice for rent assistance and copy of final notice for utility assistance. Statements or receipts of bills for previous 2 months.   [facility] [place] [address]  [place], [place] [address] [phone] (Main) [phone] (Alternate) Mon 9:00am - 5:00pm; Tue 9:00am - 5:00pm; Wed 9:00am - 5:00pm; Thu 9:00am - 5:00pm; Fri 9:00am - 5:00pm; Website: http://[email or web address].salvationarmy.org/[place]/emergency-financial-assistance Email: [email or web address] .[email or web address] Eligibility: People experiencing a housing crisis with past-due rent and/or utilities and meet income limits. Must be willing to take part in 6 Call or visit website to download application. Return complete application by mail or email only. Documents: Help with Utilities: Photo ID, proof of household income, copies of monthly bills or receipts, and a final disconnection/shut-off notice. Help with Rent or Mortgage: Photo ID, proof of income, copies of monthly bills or receipts, and eviction notice. Help with Household Goods: Photo ID, proof of household income, copies of monthly bills or receipts, and a fire or flood report.  [facility] [place] [address] [place], [place] [address] [phone] (Main) Mon 8:00am - 5:00pm; Tue 8:00am - 5:00pm; Wed 8:00am - 5:00pm; Thu 8:00am - 5:00pm; Fri 8:00am - 12:00pm; Website: http://southernusa.salvationarmy.org/[place]/emergency-financial-assistance Email: [email or web address] Call for eligibility information. Apply :Utilities Assistance: Visit office by 8:30am on 1st and 4th Monday of each month to pick up application. Rent and Mortgage Assistance: Visit office by 8:30am on 2nd and 3rd Monday of each month to pick up application. NOTE: If Monday falls on a holiday applications can be picked up the following Tuesday. Documents required will be listed on application.  [facility] -  [phone] (Main) Seen by appointment only. Call for more  information. Eligibility: Meet income limits. Apply: Call for information on how to schedule an appointment. Each month there is a specific day to  call to schedule an appointment. It is stated on the agency voicemail message. Appointments fill up quickly each month. Documents: Photo ID, copy of current utility bill.  [facility] [place] [address], [place], [place] [address] [phone] (Main) Tue 9:00am - 4:00pm; Wed 9:00am - 4:00pm; Thu 9:00am - 4:00pm; Website: http://www.helpinghandshighpoint.org Email: helpinghandsclientassistance@gmail .com Eligibility: Utility Assistance: Meet income limits and be a Holiday Representative. [employer] customers do not qualify. Must not have received utility assistance for another agency within the last 90 days. Rent Assistance: Residents of [place] who meet income limits. Must not have received rent assistance for another agency within the last 90 days. Apply: Call to schedule an appointment. Documents: Utility Assistance: Photo ID, City of [employer], copy of lease (if not paying a mortgage), proof of income, and monthly expenses. Rent Assistance: Photo ID, W-9 from the landlord, copy of the lease, proof of income, and a list of monthly expenses.  [facility] [place] [address] [place], [place] [address] [phone] (Main: Help With Rent) [phone] (Main: Help With Utilities) Mon 9:00am - 4:00pm; Tue 9:00am - 4:00pm; Wed 9:00am - 4:00pm; Thu 9:00am - 4:00pm; Fri 9:00am - 4:00pm; Website: motivationalsites.no Email: [email or web address] Eligibility: People experiencing a financial crisis. Apply: Call to schedule an appointment Wednesday, 7:30am. Documents: Photo ID, Social Security card, proof of income, and proof of address. Other documents may be required, depending on service. Call for more information.  LOW INCOME ENERGY ASSISTANCE PROGRAM DEPARTMENT OF SOCIAL SERVICES - [facility] [address] [place], [place] [address] [phone] (Main) Mon 8:00am -  5:00pm; Tue 8:00am - 5:00pm; Wed 8:00am - 5:00pm; Thu 8:00am - 5:00pm; Fri 8:00am - 5:00pm; Website: [email or web address] Eligibility: Meet income limits and resource guidelines. Each household is only eligible once, even if multiple members apply. Apply: Call to see if funds are available. Visit to complete an application, call to have 1 mailed, or apply online at [email or web address] NOTE: Households with a person age 96 and over or a person with a documented disability can apply beginning December 1. Other households can apply beginning January 1. Documents: Photo ID, birth certificate, proof of household income, copy of utility bill, latest bank statement, the names and Social Security numbers for everyone in the household, and proof of disability if under age 7.  LOW INCOME ENERGY ASSISTANCE PROGRAM DEPARTMENT OF SOCIAL SERVICES - [facility] [address] [place], [place], [place] [address] [phone] (Main) Mon 8:00am - 5:00pm; Tue 8:00am - 5:00pm; Wed 8:00am - 5:00pm; Thu 8:00am - 5:00pm; Fri 8:00am - 5:00pm; Website: [email or web address] Eligibility: Meet income limits and resource guidelines. Each household is only eligible once, even if multiple members apply. Apply: Call to see if funds are available. Visit to complete an application, call to have 1 mailed, or apply online at [email or web address] NOTE: Households with a person age 61 and over or a person with a documented disability can apply beginning December 1. Other households can apply beginning January 1. Documents: Photo ID, birth certificate, proof of household income, copy of utility bill, latest bank statement, the names and Social Security numbers for everyone in the household, and proof of disability if under age 66. SUNDAYS BREAKFAST TWO LOCATIONS: 8:00am served in [facility] by Awaken  Ppl [employer] 8:30am SHUTTLE  provided from [facility], served at [employer], [address] LUNCH TWO LOCATIONS [plus one additional third Sunday only] 10:30am - 12:30pm served at [employer], [employer], [place] [address] (1.2 miles from [facility]) 12:30pm served in [place] by Land O'lakes Team (THIRD Sunday only) 1:30pm served at [facility] by [facility] one location [plus one additional third Sunday only] 5:00pm Every Sunday, served under the bridge at [address] by Dovie Under the 3m Company (.7 miles from [facility]) (THIRD Sunday ONLY) 4:00pm served in the parking garage, across from Nucor Corporation, corner of [place] and [place] by Ryland Group Works Ministries MONDAYS BREAKFAST 7:30am served in [employer] by the United [employer] and Friends LUNCH 10:30am - 12:30pm served at [employer], [employer], [place] [address] (1.2 miles from [facility]) DINNER TWO LOCATIONS: 7:00pm served in front of the courthouse at the corner of Goldman Sachs and Regions Financial Corporation. by [place] Monday Night Meal (3 blocks from [facility]) 4:30pm served at the Autonation, [address] by Bank Of [place] Company (0.6 miles from [facility]) TUESDAYS BREAKFAST 8:00am - 9:00am served at The Tjx Companies, [address] (0.3 miles from [place]) LUNCH 10:30am - 12:30pm served at the [employer], [employer] [address], (1.2 miles from [place]) DINNER 6:00pm served at Csx Corporation, enter from Capital One and go to the Sonic Automotive, (0.7 miles from [place]) [facility] BREAKFAST 7:00am - 8:00am served at [employer], [employer] [address] (1.2 miles from [place]) LUNCH ONE LOCATION [plus two additional locations listed  below] 10:30am - 12:30pm served at [employer], [employer] [address] (1.2 miles from [place]) (FIRST Wednesday ONLY) 11:30am served at Dillard's, [place] [address] (6.6 miles from [place]) (SECOND Wednesday ONLY) 11:00am served at Kimberly. Garnette Ava Blackwood of [address], [address] (1.3 miles from [place]) [place] TWO LOCATIONS 6:00pm served at W. R. Berkley, [place] [address]. (1.3 miles from [facility]) 4:00pm - 6:00pm (hot dogs and chips) served at Levi Strauss of [facility], [address] (1.7 miles from [place]) [place] BREAKFAST NOT AVAILABLE AT THIS TIME LUNCH 10:30am - 12:30pm served at [employer], [employer], [place] [address], (1.2 miles from [place]) DINNER 6:00pm served at Csx Corporation, enter from Capital One and go to the Sonic Automotive, (0.7 miles from Nucor Corporation) [place] BREAKFAST NOT AVAILABLE AT THIS TIME LUNCH 10:30am - 12:30pm served at [employer], [employer] [address] (1.2 miles from [place]) DINNER TWO LOCATIONS, [plus one additional first Friday only] 6:00pm served under the bridge at [address] by Dovie Under Csx Corporation. (.7 miles from [facility]) 5:00pm - 7:00pm served at Levi Strauss of [facility], [address] (1.7 miles from [place]) (FIRST Friday ONLY) 5:45 pm - SHUTTLE provided from the LIBRARY at 5:45pm. Served at [facility], [address]. SATURDAYS BREAKFAST TWO LOCATIONS [plus one additional last Saturday only] 8:00am served at [facility] by Delphi 8:30am served at Pulte Homes, [address] (2.2 miles from [facility]  Park) (LAST Saturday ONLY) 8:30am served at Beazer Homes, [address] (5 miles  from Nucor Corporation) LUNCH 10:30am - 12:30pm served at [employer], [employer] [address] (1.2 miles from [facility]) DINNER 6:00pm served under the bridge at [address] by World Fuel Services Corporation (0.7 miles from Nucor Corporation)  DIRECTIONS FROM [place] TO ALL MEAL LOCATIONS The Bridge at [address] (.7 miles from [address]) [address] on [place]. Turn Right onto Directv 433 ft. Continue onto [address] under bridge, about 500 ft. Courthouse (3 blocks from [facility]) South on [address]. Turn right on [address]  1 block to Ppl Corporation (.5 miles from [place]) [place] on [place]. Yrc Worldwide. past Brink's Company to Emcor. Enter from Capital One and go to the Affiliated Computer Services building W. R. Berkley [address] V[address]. (1.3 miles from [facility]) [address] on [place]. Turn Right onto [address]. church will be on the Left. The Tjx Companies [address] (.3 miles from [facility]) Go .3 miles on [address] Destination is on your right Dillard's at [place] (6.6 miles from [address]) [address] on [place] toward [address] Turn right onto W Friend[address] Continue onto Alcoa Inc. Continue onto Toll Brothers. 5. Tommi is on right SANMINA-SCI Futures Trader) [address] (.6 miles from [place]) [place] on [place]. [address] Turn Left onto [address] 0.3 miles Destination is on the Left. [place] Black & Decker at American Express (5 miles from Nucor Corporation) 1. Head south on [address]. Turn right onto [address] Turn slightly left onto Quest Diagnostics Continue onto Quest Diagnostics Turn right at Barnes & Noble Continue to church on right [place] Sounds of [facility] [address] (1.7 miles from [place]) [address] on [place] 1.4 miles [place] becomes  [place]. [address]. Continue 0.6 miles and church will be on theright. [place] Guardian Life Insurance at [address] (2.5 miles from Nucor Corporation) [place] provided from Massachusetts Mutual Life [place]] [place] on [place]. Elm toward Estée Lauder right onto Costco Wholesale left onto Emerson Electric Turn left onto Micron Technology [address] (2.2 miles from [address]) [address] on [place] 1.4 miles [place] becomes [place]. [place] [address] Turn right onto [address] and church will be on the Left. Potter's House/[place] At&t [address] (1.2 miles from [facility]) 1.Turn right onto [facility] 2.Turn left onto GEANNIE Beagle 3.Micheline MICAEL Jama 4.Destination is on your right [place]. Garnette Ava Tommi of [address] at Toysrus (1.3 miles from [facility]) [address] on [address] Turn left onto Genuine Parts right onto S. Maryellen Cassis. Continue onto Kb Home [place]. Turn left onto Smurfit-stone Container. Turn right onto Wellpoint.

## 2024-10-24 NOTE — Anesthesia Postprocedure Evaluation (Signed)
 Anesthesia Post Note  Patient: Lenice Koper Kunin  Procedure(s) Performed: OPEN REDUCTION INTERNAL FIXATION (ORIF) ANKLE FRACTURE (Right: Ankle)     Patient location during evaluation: PACU Anesthesia Type: General Level of consciousness: sedated and patient cooperative Pain management: pain level controlled Vital Signs Assessment: post-procedure vital signs reviewed and stable Respiratory status: spontaneous breathing Cardiovascular status: stable Anesthetic complications: no   No notable events documented.  Last Vitals:  Vitals:   10/24/24 0430 10/24/24 0808  BP: 117/68 125/73  Pulse: 80 78  Resp: 18 18  Temp: 36.8 C 37 C  SpO2: 96% 96%    Last Pain:  Vitals:   10/24/24 0904  TempSrc:   PainSc: 5                  Norleen Pope

## 2024-10-24 NOTE — Assessment & Plan Note (Signed)
 Sleep improved last night - Continue traZODone  100 mg QHS

## 2024-10-24 NOTE — Assessment & Plan Note (Signed)
 Rheumatoid Arthritis : Hold Methotrexate 15 mg weekly, Plaquenil 300 mg daily

## 2024-10-24 NOTE — Plan of Care (Signed)
  Problem: Education: Goal: Knowledge of General Education information will improve Description: Including pain rating scale, medication(s)/side effects and non-pharmacologic comfort measures Outcome: Adequate for Discharge   Problem: Health Behavior/Discharge Planning: Goal: Ability to manage health-related needs will improve Outcome: Adequate for Discharge   Problem: Clinical Measurements: Goal: Ability to maintain clinical measurements within normal limits will improve Outcome: Adequate for Discharge Goal: Will remain free from infection Outcome: Adequate for Discharge Goal: Diagnostic test results will improve Outcome: Adequate for Discharge Goal: Respiratory complications will improve Outcome: Adequate for Discharge Goal: Cardiovascular complication will be avoided Outcome: Adequate for Discharge   Problem: Activity: Goal: Risk for activity intolerance will decrease Outcome: Adequate for Discharge   Problem: Nutrition: Goal: Adequate nutrition will be maintained Outcome: Adequate for Discharge   Problem: Coping: Goal: Level of anxiety will decrease Outcome: Adequate for Discharge   Problem: Elimination: Goal: Will not experience complications related to bowel motility Outcome: Adequate for Discharge Goal: Will not experience complications related to urinary retention Outcome: Adequate for Discharge   Problem: Pain Managment: Goal: General experience of comfort will improve and/or be controlled Outcome: Adequate for Discharge   Problem: Safety: Goal: Ability to remain free from injury will improve Outcome: Adequate for Discharge   Problem: Skin Integrity: Goal: Risk for impaired skin integrity will decrease Outcome: Adequate for Discharge   Problem: Acute Rehab PT Goals(only PT should resolve) Goal: Patient Will Transfer Sit To/From Stand Outcome: Adequate for Discharge Goal: Pt Will Ambulate Outcome: Adequate for Discharge Goal: Pt Will Go Up/Down  Stairs Outcome: Adequate for Discharge   Problem: Acute Rehab OT Goals (only OT should resolve) Goal: Pt. Will Perform Lower Body Bathing Outcome: Adequate for Discharge Goal: Pt. Will Perform Lower Body Dressing Outcome: Adequate for Discharge Goal: Pt. Will Transfer To Toilet Outcome: Adequate for Discharge Goal: OT Additional ADL Goal #1 Outcome: Adequate for Discharge

## 2024-10-24 NOTE — Evaluation (Signed)
 Physical Therapy Evaluation Patient Details Name: Elizabeth Yoder MRN: [id number] DOB: [birth year] Today's Date: 10/24/2024  History of Present Illness  68 y.o. female admitted 10/22/24 after fall sustaining R trimalleolar ankle fx dislocation; attempted closed reduction with persistent subluxation. S/p R ankle ORIF 11/25. PMH includes RA, NASH, GERD.  Clinical Impression  Pt presents with an overall decrease in functional mobility secondary to above. PTA, pt independent, works, drives, lives with roommate. Initiated educ re: precautions, positioning, therex, DVT prevention, edema control, activity recommendations. Pt able to initiate transfer and gait training, able to perform brief bouts of hopping on LLE with RW and CGA while maintaining RLE NWB; limited by pain, generalized weakness, impaired balance. Per ortho MD, plan for potential d/c home today; pt to borrow some DME from sister, son to help transport home; will require wheelchair use and increased assist given current functional mobility deficits. If pt to remain admitted, will follow acutely to address established goals.       If plan is discharge home, recommend the following: A little help with walking and/or transfers;A little help with bathing/dressing/bathroom;Assistance with cooking/housework;Assist for transportation;Help with stairs or ramp for entrance   Can travel by private vehicle    Yes    Equipment Recommendations Rolling walker (2 wheels) (will borrow wheelchair & BSC from family)  Recommendations for Other Services       Functional Status Assessment Patient has had a recent decline in their functional status and demonstrates the ability to make significant improvements in function in a reasonable and predictable amount of time.     Precautions / Restrictions Precautions Precautions: Fall Recall of Precautions/Restrictions: Intact Restrictions Weight Bearing Restrictions Per Provider Order: Yes RLE Weight Bearing  Per Provider Order: Non weight bearing      Mobility  Bed Mobility Overal bed mobility: Modified Independent             General bed mobility comments: HOB slightly elevated, use of bed rail    Transfers Overall transfer level: Needs assistance Equipment used: Rolling walker (2 wheels) Transfers: Sit to/from Stand Sit to Stand: Contact guard assist           General transfer comment: sit<>stand from EOB, BSC and recliner to RW, cues for hand placement and sequencing, pt with good ability to extend R foot in air to ensure NWB    Ambulation/Gait Ambulation/Gait assistance: Contact guard assist Gait Distance (Feet): 6 Feet Assistive device: Rolling walker (2 wheels)   Gait velocity: Decreased     General Gait Details: initial hop pivot on LLE with RW from bed>BSC, additional hops to recliner; CGA for balance, cues for sequencing, decreased L foot clearance; distance limited by pain and fatigue  Stairs Stairs:  (discussed recommended option of having pt's son bump her up backwards in wheelchair on 2 steps to enter home)          Wheelchair Mobility     Tilt Bed    Modified Rankin (Stroke Patients Only)       Balance Overall balance assessment: Needs assistance Sitting-balance support: No upper extremity supported Sitting balance-Leahy Scale: Good Sitting balance - Comments: indep to don L sock sitting EOB, indep toileting/pericare sitting on BSC   Standing balance support: Bilateral upper extremity supported, During functional activity, Reliant on assistive device for balance Standing balance-Leahy Scale: Poor Standing balance comment: reliant on RW support  Pertinent Vitals/Pain Pain Assessment Pain Assessment: Faces Faces Pain Scale: Hurts even more Pain Location: RLE, especially proximal thigh Pain Descriptors / Indicators: Discomfort, Grimacing, Guarding Pain Intervention(s): Monitored during session,  Limited activity within patient's tolerance, Patient requesting pain meds-RN notified, Repositioned    Home Living Family/patient expects to be discharged to:: Private residence Living Arrangements: Non-relatives/Friends (roommate) Available Help at Discharge: Family;Friend(s) Type of Home: House Home Access: Stairs to enter Entrance Stairs-Rails: Right Entrance Stairs-Number of Steps: 2   Home Layout: One level   Additional Comments: pt does not own any DME; can borrow DME from sister, including manual w/c with elevating leg rests, rollator, standard walker, BSC    Prior Function Prior Level of Function : Independent/Modified Independent;Working/employed;Driving             Mobility Comments: indep without DME; lives with roommate who works but can assist with household tasks; pt's son plans to drive her home and help her but does not plan to stay overnight. 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 ADLs Comments: indep     Extremity/Trunk Assessment   Upper Extremity Assessment Upper Extremity Assessment: Generalized weakness (reports h/o rheumatoid arthritis and LUE weaker from this)    Lower Extremity Assessment Lower Extremity Assessment: RLE deficits/detail RLE Deficits / Details: s/p R ankle ORIF immobilized in hard cast, expected postop pain and weakness; good quad activation able to perform full range LAQ, unable to perform full SLR       Communication   Communication Communication: No apparent difficulties    Cognition Arousal: Alert Behavior During Therapy: WFL for tasks assessed/performed   PT - Cognitive impairments: No apparent impairments                       PT - Cognition Comments: no significant cognitive impairment noted, though pt seems understandably overwhelmed by amount of info provided to her with potential d/c home today. encouraged her to write down questions to help remember as she thought of  them Following commands: Intact       Cueing Cueing Techniques: Verbal cues     General Comments General comments (skin integrity, edema, etc.): initiated educ re: role of acute PT, POC, precautions, positioning, edema control, DVT prevention, activity recommendations, LE AROM/therex (HEP provided), importance of mobility. pt likely to d/c home today per ortho MD, therefore increased time discussing safe mobility with this, including hop pivot transfers with RW, wheelchair use, initial increased assist from family/friends, fall risk reduction    Exercises Other Exercises Other Exercises: quad sets, LAQ, SLR initiation though unable to fully lift RLE from bed surface; [employer] HEP handout provided for BLE therex/AROM   Assessment/Plan    PT Assessment Patient needs continued PT services  PT Problem List Decreased strength;Decreased range of motion;Decreased activity tolerance;Decreased balance;Decreased mobility;Decreased knowledge of use of DME;Decreased knowledge of precautions;Pain       PT Treatment Interventions DME instruction;Gait training;Stair training;Functional mobility training;Therapeutic activities;Therapeutic exercise;Balance training;Patient/family education;Wheelchair mobility training    PT Goals (Current goals can be found in the Care Plan section)  Acute Rehab PT Goals Patient Stated Goal: decreased pain, regain independence PT Goal Formulation: With patient Time For Goal Achievement: 11/07/24 Potential to Achieve Goals: Good    Frequency Min 3X/week     Co-evaluation               AM-PAC PT 6 Clicks Mobility  Outcome Measure Help needed turning from your back to  your side while in a flat bed without using bedrails?: None Help needed moving from lying on your back to sitting on the side of a flat bed without using bedrails?: None Help needed moving to and from a bed to a chair (including a wheelchair)?: A Little Help needed standing up from a chair  using your arms (e.g., wheelchair or bedside chair)?: A Little Help needed to walk in hospital room?: A Little Help needed climbing 3-5 steps with a railing? : A Lot 6 Click Score: 19    End of Session Equipment Utilized During Treatment: Gait belt Activity Tolerance: Patient tolerated treatment well;Patient limited by pain Patient left: in chair;with call bell/phone within reach;with chair alarm set Nurse Communication: Mobility status;Patient requests pain meds PT Visit Diagnosis: Other abnormalities of gait and mobility (R26.89);Muscle weakness (generalized) (M62.81);Pain Pain - Right/Left: Right Pain - part of body: Ankle and joints of foot    Time: 0812-0855 PT Time Calculation (min) (ACUTE ONLY): 43 min   Charges:   PT Evaluation $PT Eval Moderate Complexity: 1 Mod PT Treatments $Therapeutic Exercise: 23-37 mins PT General Charges $$ ACUTE PT VISIT: 1 Visit       Darice Almas, PT, DPT Acute Rehabilitation Services  Personal: [employer] Rehab Office: [phone]  Freeland [address]acht L Naomia Lenderman 10/24/2024, 10:14 AM
